# Patient Record
Sex: Female | Born: 1935 | Race: Black or African American | Hispanic: No
Health system: Southern US, Community
[De-identification: ages and names within clinical notes are randomized; demographics above are authoritative.]

## PROBLEM LIST (undated history)

## (undated) DIAGNOSIS — C50919 Malignant neoplasm of unspecified site of unspecified female breast: Secondary | ICD-10-CM

## (undated) DIAGNOSIS — I10 Essential (primary) hypertension: Secondary | ICD-10-CM

## (undated) DIAGNOSIS — I639 Cerebral infarction, unspecified: Secondary | ICD-10-CM

## (undated) DIAGNOSIS — G459 Transient cerebral ischemic attack, unspecified: Secondary | ICD-10-CM

## (undated) DIAGNOSIS — C801 Malignant (primary) neoplasm, unspecified: Secondary | ICD-10-CM

## (undated) HISTORY — PX: JOINT REPLACEMENT: SHX530

---

## 1999-12-02 ENCOUNTER — Emergency Department (HOSPITAL_COMMUNITY): Admission: EM | Admit: 1999-12-02 | Discharge: 1999-12-02 | Payer: Self-pay | Admitting: Emergency Medicine

## 2009-06-04 ENCOUNTER — Emergency Department (HOSPITAL_BASED_OUTPATIENT_CLINIC_OR_DEPARTMENT_OTHER): Admission: EM | Admit: 2009-06-04 | Discharge: 2009-06-04 | Payer: Self-pay | Admitting: Emergency Medicine

## 2009-06-04 ENCOUNTER — Ambulatory Visit: Payer: Self-pay | Admitting: Diagnostic Radiology

## 2009-06-25 ENCOUNTER — Emergency Department (HOSPITAL_BASED_OUTPATIENT_CLINIC_OR_DEPARTMENT_OTHER): Admission: EM | Admit: 2009-06-25 | Discharge: 2009-06-25 | Payer: Self-pay | Admitting: Emergency Medicine

## 2009-08-13 ENCOUNTER — Emergency Department (HOSPITAL_BASED_OUTPATIENT_CLINIC_OR_DEPARTMENT_OTHER): Admission: EM | Admit: 2009-08-13 | Discharge: 2009-08-13 | Payer: Self-pay | Admitting: Emergency Medicine

## 2009-08-13 ENCOUNTER — Ambulatory Visit: Payer: Self-pay | Admitting: Diagnostic Radiology

## 2009-10-29 ENCOUNTER — Emergency Department (HOSPITAL_BASED_OUTPATIENT_CLINIC_OR_DEPARTMENT_OTHER): Admission: EM | Admit: 2009-10-29 | Discharge: 2009-10-29 | Payer: Self-pay | Admitting: Emergency Medicine

## 2009-10-29 ENCOUNTER — Ambulatory Visit: Payer: Self-pay | Admitting: Radiology

## 2010-03-07 ENCOUNTER — Emergency Department (HOSPITAL_BASED_OUTPATIENT_CLINIC_OR_DEPARTMENT_OTHER)
Admission: EM | Admit: 2010-03-07 | Discharge: 2010-03-07 | Disposition: A | Discharge status: Home / Self Care | Payer: Medicare Other | Attending: Emergency Medicine | Admitting: Emergency Medicine

## 2010-03-07 DIAGNOSIS — I10 Essential (primary) hypertension: Secondary | ICD-10-CM | POA: Insufficient documentation

## 2010-03-07 DIAGNOSIS — Z79899 Other long term (current) drug therapy: Secondary | ICD-10-CM | POA: Insufficient documentation

## 2010-03-07 DIAGNOSIS — F0390 Unspecified dementia without behavioral disturbance: Secondary | ICD-10-CM | POA: Insufficient documentation

## 2010-03-07 DIAGNOSIS — Z8679 Personal history of other diseases of the circulatory system: Secondary | ICD-10-CM | POA: Insufficient documentation

## 2010-03-07 DIAGNOSIS — F329 Major depressive disorder, single episode, unspecified: Secondary | ICD-10-CM | POA: Insufficient documentation

## 2010-03-07 DIAGNOSIS — R51 Headache: Secondary | ICD-10-CM | POA: Insufficient documentation

## 2010-03-07 DIAGNOSIS — F3289 Other specified depressive episodes: Secondary | ICD-10-CM | POA: Insufficient documentation

## 2010-03-07 DIAGNOSIS — Z853 Personal history of malignant neoplasm of breast: Secondary | ICD-10-CM | POA: Insufficient documentation

## 2010-03-07 LAB — BASIC METABOLIC PANEL
BUN: 18 mg/dL (ref 6–23)
CO2: 30 mEq/L (ref 19–32)
Chloride: 103 mEq/L (ref 96–112)
GFR calc non Af Amer: >60 mL/min (ref >60)
Glucose, Bld: 157 mg/dL — ABNORMAL HIGH (ref 70–99)
Potassium: 3.3 mEq/L — ABNORMAL LOW (ref 3.5–5.1)
Sodium: 144 mEq/L (ref 135–145)

## 2010-03-07 LAB — URINALYSIS, ROUTINE W REFLEX MICROSCOPIC
Bilirubin Urine: NEGATIVE
Ketones, ur: NEGATIVE mg/dL
Specific Gravity, Urine: 1.023 (ref 1.005–1.030)
Urine Glucose, Fasting: NEGATIVE mg/dL
pH: 6 (ref 5.0–8.0)

## 2010-03-07 LAB — DIFFERENTIAL
Eosinophils Relative: 0 % (ref 0–5)
Lymphocytes Relative: 7 % — ABNORMAL LOW (ref 12–46)
Lymphs Abs: 0.5 10*3/uL — ABNORMAL LOW (ref 0.7–4.0)
Neutro Abs: 5.7 10*3/uL (ref 1.7–7.7)

## 2010-03-07 LAB — CBC
HCT: 36.3 % (ref 36.0–46.0)
Hemoglobin: 11.9 g/dL — ABNORMAL LOW (ref 12.0–15.0)
MCV: 89.6 fL (ref 78.0–100.0)
RDW: 13.1 % (ref 11.5–15.5)
WBC: 6.6 10*3/uL (ref 4.0–10.5)

## 2010-04-09 LAB — URINALYSIS, ROUTINE W REFLEX MICROSCOPIC
Bilirubin Urine: NEGATIVE
Glucose, UA: NEGATIVE mg/dL
Hgb urine dipstick: NEGATIVE
Specific Gravity, Urine: 1.013 (ref 1.005–1.030)
Urobilinogen, UA: 0.2 mg/dL (ref 0.0–1.0)

## 2010-04-09 LAB — BASIC METABOLIC PANEL
CO2: 33 mEq/L — ABNORMAL HIGH (ref 19–32)
Calcium: 8.8 mg/dL (ref 8.4–10.5)
Chloride: 101 mEq/L (ref 96–112)
GFR calc Af Amer: >60 mL/min (ref >60)
Glucose, Bld: 136 mg/dL — ABNORMAL HIGH (ref 70–99)
Potassium: 3.6 mEq/L (ref 3.5–5.1)
Sodium: 143 mEq/L (ref 135–145)

## 2010-04-09 LAB — DIFFERENTIAL
Basophils Relative: 1 % (ref 0–1)
Eosinophils Absolute: 0.1 10*3/uL (ref 0.0–0.7)
Lymphs Abs: 1.1 10*3/uL (ref 0.7–4.0)
Monocytes Absolute: 0.4 10*3/uL (ref 0.1–1.0)
Monocytes Relative: 6 % (ref 3–12)
Neutro Abs: 4.3 10*3/uL (ref 1.7–7.7)
Neutrophils Relative %: 72 % (ref 43–77)

## 2010-04-09 LAB — CBC
HCT: 32.9 % — ABNORMAL LOW (ref 36.0–46.0)
Hemoglobin: 11.5 g/dL — ABNORMAL LOW (ref 12.0–15.0)
MCH: 30.2 pg (ref 26.0–34.0)
MCHC: 34.9 g/dL (ref 30.0–36.0)
RBC: 3.81 MIL/uL — ABNORMAL LOW (ref 3.87–5.11)

## 2010-04-09 LAB — URINE MICROSCOPIC-ADD ON

## 2010-04-14 LAB — CULTURE, BLOOD (ROUTINE X 2)
Culture: NO GROWTH
Culture: NO GROWTH

## 2010-04-14 LAB — POCT CARDIAC MARKERS
CKMB, poc: 1.7 ng/mL (ref 1.0–8.0)
Myoglobin, poc: 91.3 ng/mL (ref 12–200)
Troponin i, poc: <0.05 ng/mL (ref 0.00–0.09)

## 2010-04-14 LAB — HEPATIC FUNCTION PANEL
ALT: 18 U/L (ref 0–35)
AST: 37 U/L (ref 0–37)
Albumin: 3.6 g/dL (ref 3.5–5.2)
Alkaline Phosphatase: 50 U/L (ref 39–117)
Bilirubin, Direct: 0 mg/dL (ref 0.0–0.3)
Indirect Bilirubin: 0.8 mg/dL (ref 0.3–0.9)
Total Bilirubin: 0.8 mg/dL (ref 0.3–1.2)
Total Protein: 7.4 g/dL (ref 6.0–8.3)

## 2010-04-14 LAB — BASIC METABOLIC PANEL
BUN: 10 mg/dL (ref 6–23)
CO2: 29 mEq/L (ref 19–32)
Calcium: 8.5 mg/dL (ref 8.4–10.5)
Chloride: 103 mEq/L (ref 96–112)
Creatinine, Ser: 0.7 mg/dL (ref 0.4–1.2)
Glucose, Bld: 133 mg/dL — ABNORMAL HIGH (ref 70–99)

## 2010-04-14 LAB — CBC
HCT: 36 % (ref 36.0–46.0)
Hemoglobin: 12.4 g/dL (ref 12.0–15.0)
MCHC: 34.5 g/dL (ref 30.0–36.0)
MCV: 88.4 fL (ref 78.0–100.0)
Platelets: 213 10*3/uL (ref 150–400)
RBC: 4.08 MIL/uL (ref 3.87–5.11)
RDW: 12.9 % (ref 11.5–15.5)
WBC: 6.2 K/uL (ref 4.0–10.5)

## 2010-04-14 LAB — URINALYSIS, ROUTINE W REFLEX MICROSCOPIC
Bilirubin Urine: NEGATIVE
Glucose, UA: NEGATIVE mg/dL
Hgb urine dipstick: NEGATIVE
Ketones, ur: 15 mg/dL — AB
Nitrite: NEGATIVE
Protein, ur: NEGATIVE mg/dL
Specific Gravity, Urine: 1.02 (ref 1.005–1.030)
Urobilinogen, UA: 1 mg/dL (ref 0.0–1.0)
pH: 7 (ref 5.0–8.0)

## 2010-04-14 LAB — URINE CULTURE
Colony Count: NO GROWTH
Culture: NO GROWTH

## 2010-04-14 LAB — DIFFERENTIAL
Basophils Absolute: 0 10*3/uL (ref 0.0–0.1)
Basophils Relative: 0 % (ref 0–1)
Eosinophils Absolute: 0 10*3/uL (ref 0.0–0.7)
Eosinophils Relative: 0 % (ref 0–5)
Lymphocytes Relative: 7 % — ABNORMAL LOW (ref 12–46)
Lymphs Abs: 0.4 K/uL — ABNORMAL LOW (ref 0.7–4.0)
Monocytes Absolute: 0.3 K/uL (ref 0.1–1.0)
Monocytes Relative: 6 % (ref 3–12)
Neutro Abs: 5.5 K/uL (ref 1.7–7.7)
Neutrophils Relative %: 87 % — ABNORMAL HIGH (ref 43–77)

## 2010-04-14 LAB — BASIC METABOLIC PANEL WITH GFR
GFR calc Af Amer: >60 mL/min (ref >60)
GFR calc non Af Amer: >60 mL/min (ref >60)
Potassium: 3.9 meq/L (ref 3.5–5.1)
Sodium: 141 meq/L (ref 135–145)

## 2010-05-11 ENCOUNTER — Emergency Department (HOSPITAL_BASED_OUTPATIENT_CLINIC_OR_DEPARTMENT_OTHER)
Admission: EM | Admit: 2010-05-11 | Discharge: 2010-05-11 | Disposition: A | Discharge status: Home / Self Care | Payer: Medicare Other | Attending: Emergency Medicine | Admitting: Emergency Medicine

## 2010-05-11 ENCOUNTER — Emergency Department (INDEPENDENT_AMBULATORY_CARE_PROVIDER_SITE_OTHER): Payer: Medicare Other

## 2010-05-11 DIAGNOSIS — I10 Essential (primary) hypertension: Secondary | ICD-10-CM | POA: Insufficient documentation

## 2010-05-11 DIAGNOSIS — Z79899 Other long term (current) drug therapy: Secondary | ICD-10-CM | POA: Insufficient documentation

## 2010-05-11 DIAGNOSIS — G319 Degenerative disease of nervous system, unspecified: Secondary | ICD-10-CM | POA: Insufficient documentation

## 2010-05-11 DIAGNOSIS — Z8679 Personal history of other diseases of the circulatory system: Secondary | ICD-10-CM | POA: Insufficient documentation

## 2010-05-11 DIAGNOSIS — W2209XA Striking against other stationary object, initial encounter: Secondary | ICD-10-CM | POA: Insufficient documentation

## 2010-05-11 DIAGNOSIS — S0990XA Unspecified injury of head, initial encounter: Secondary | ICD-10-CM | POA: Insufficient documentation

## 2010-05-11 DIAGNOSIS — F0390 Unspecified dementia without behavioral disturbance: Secondary | ICD-10-CM

## 2010-05-11 DIAGNOSIS — R51 Headache: Secondary | ICD-10-CM

## 2010-05-11 DIAGNOSIS — W19XXXA Unspecified fall, initial encounter: Secondary | ICD-10-CM

## 2010-05-11 DIAGNOSIS — F29 Unspecified psychosis not due to a substance or known physiological condition: Secondary | ICD-10-CM

## 2010-05-11 DIAGNOSIS — Y9210 Unspecified residential institution as the place of occurrence of the external cause: Secondary | ICD-10-CM | POA: Insufficient documentation

## 2010-05-11 DIAGNOSIS — Z853 Personal history of malignant neoplasm of breast: Secondary | ICD-10-CM | POA: Insufficient documentation

## 2010-08-05 ENCOUNTER — Emergency Department (HOSPITAL_BASED_OUTPATIENT_CLINIC_OR_DEPARTMENT_OTHER)
Admission: EM | Admit: 2010-08-05 | Discharge: 2010-08-05 | Disposition: A | Discharge status: Skilled Nursing Facility | Payer: Medicare Other | Attending: Emergency Medicine | Admitting: Emergency Medicine

## 2010-08-05 ENCOUNTER — Emergency Department (INDEPENDENT_AMBULATORY_CARE_PROVIDER_SITE_OTHER): Payer: Medicare Other

## 2010-08-05 ENCOUNTER — Encounter: Payer: Self-pay | Admitting: Family Medicine

## 2010-08-05 DIAGNOSIS — R51 Headache: Secondary | ICD-10-CM

## 2010-08-05 DIAGNOSIS — S8990XA Unspecified injury of unspecified lower leg, initial encounter: Secondary | ICD-10-CM

## 2010-08-05 DIAGNOSIS — M25569 Pain in unspecified knee: Secondary | ICD-10-CM | POA: Insufficient documentation

## 2010-08-05 DIAGNOSIS — Y9210 Unspecified residential institution as the place of occurrence of the external cause: Secondary | ICD-10-CM | POA: Insufficient documentation

## 2010-08-05 DIAGNOSIS — Z8673 Personal history of transient ischemic attack (TIA), and cerebral infarction without residual deficits: Secondary | ICD-10-CM

## 2010-08-05 DIAGNOSIS — W1789XA Other fall from one level to another, initial encounter: Secondary | ICD-10-CM | POA: Insufficient documentation

## 2010-08-05 DIAGNOSIS — G319 Degenerative disease of nervous system, unspecified: Secondary | ICD-10-CM

## 2010-08-05 DIAGNOSIS — F0150 Vascular dementia without behavioral disturbance: Secondary | ICD-10-CM | POA: Insufficient documentation

## 2010-08-05 DIAGNOSIS — W010XXA Fall on same level from slipping, tripping and stumbling without subsequent striking against object, initial encounter: Secondary | ICD-10-CM

## 2010-08-05 DIAGNOSIS — F0390 Unspecified dementia without behavioral disturbance: Secondary | ICD-10-CM | POA: Insufficient documentation

## 2010-08-05 DIAGNOSIS — W19XXXA Unspecified fall, initial encounter: Secondary | ICD-10-CM

## 2010-08-05 DIAGNOSIS — I679 Cerebrovascular disease, unspecified: Secondary | ICD-10-CM

## 2010-08-05 HISTORY — DX: Malignant (primary) neoplasm, unspecified: C80.1

## 2010-08-05 HISTORY — DX: Essential (primary) hypertension: I10

## 2010-08-05 HISTORY — DX: Transient cerebral ischemic attack, unspecified: G45.9

## 2010-08-05 NOTE — ED Notes (Signed)
Pt's son at bedside and sts pt "is acting normal". Pt smiling and talking.

## 2010-08-05 NOTE — ED Provider Notes (Addendum)
History     Chief Complaint  Patient presents with  . Fall   Patient is a 77 y.o. female presenting with fall. The history is provided by the nursing home. History Limited By: Dementia.  Fall The accident occurred less than 1 hour ago. The fall occurred while walking. She fell from a height of 1 to 2 ft. She landed on a hard floor. There was no blood loss. The point of impact was the head and right knee. The pain is present in the head and right knee. The pain is mild. There was no drug use involved in the accident. There was no alcohol use involved in the accident. She has tried nothing for the symptoms.    Past Medical History  Diagnosis Date  . Vascular dementia   . Hypertension   . Transient cerebrovascular ischemia   . Cancer     Past Surgical History  Procedure Date  . Joint replacement     History reviewed. No pertinent family history.  History  Substance Use Topics  . Smoking status: Not on file  . Smokeless tobacco: Not on file  . Alcohol Use: No     pt lives in LTCF    OB History    Grav Para Term Preterm Abortions TAB SAB Ect Mult Living                  Review of Systems  Unable to perform ROS   Physical Exam  BP 187/78  Pulse 56  Temp(Src) 98.5 F (36.9 C) (Oral)  Resp 21  SpO2 100%  Physical Exam  Constitutional: She appears well-developed and well-nourished. No distress.  HENT:       Has contusion over R supraorbital ridge laterally.  Eyes: Conjunctivae and EOM are normal. Pupils are equal, round, and reactive to light.  Neck: Normal range of motion. Neck supple.  Cardiovascular: Normal rate and regular rhythm.   Pulmonary/Chest: Effort normal and breath sounds normal.  Abdominal: Bowel sounds are normal.  Musculoskeletal:       She has bony eburnation of the right knee, suggesting osteoarthritic change.  Neurological: She is alert. No cranial nerve deficit.       Sensory and motor intact.  Skin: Skin is warm and dry.  Psychiatric:      Pleasantly demented.    ED Course  Procedures  MDM   CT of head shows a contusion of the right supraorbital ridge. An incidental finding was a possible basilar artery aneurysm; radiology suggested CTA of head or MRI to further evaluate this.  X-ray of the right knee shows osteoarthritic changes. Released back to her nursing home.        Carleene Cooper III, MD 08/05/10 1610  Carleene Cooper III, MD 08/05/10 [phone]

## 2010-08-05 NOTE — ED Notes (Signed)
[facility] phoned for pt transport back to [facility].

## 2010-08-05 NOTE — ED Notes (Signed)
Per EMS, pt fell at [facility] from standing, pt hit head on floor, no LOC. Pt "knocked off balance by other resident". No n/v. Pt c/o right eye pain. Pt has hematoma above right eye.

## 2010-12-29 ENCOUNTER — Emergency Department (HOSPITAL_BASED_OUTPATIENT_CLINIC_OR_DEPARTMENT_OTHER)
Admission: EM | Admit: 2010-12-29 | Discharge: 2010-12-30 | Disposition: A | Discharge status: Home / Self Care | Payer: Medicare Other | Attending: Emergency Medicine | Admitting: Emergency Medicine

## 2010-12-29 ENCOUNTER — Encounter (HOSPITAL_BASED_OUTPATIENT_CLINIC_OR_DEPARTMENT_OTHER): Payer: Self-pay | Admitting: Emergency Medicine

## 2010-12-29 DIAGNOSIS — I10 Essential (primary) hypertension: Secondary | ICD-10-CM | POA: Insufficient documentation

## 2010-12-29 DIAGNOSIS — W19XXXA Unspecified fall, initial encounter: Secondary | ICD-10-CM | POA: Insufficient documentation

## 2010-12-29 DIAGNOSIS — F0150 Vascular dementia without behavioral disturbance: Secondary | ICD-10-CM | POA: Insufficient documentation

## 2010-12-29 DIAGNOSIS — Z79899 Other long term (current) drug therapy: Secondary | ICD-10-CM | POA: Insufficient documentation

## 2010-12-29 DIAGNOSIS — Z8679 Personal history of other diseases of the circulatory system: Secondary | ICD-10-CM | POA: Insufficient documentation

## 2010-12-29 DIAGNOSIS — I672 Cerebral atherosclerosis: Secondary | ICD-10-CM | POA: Insufficient documentation

## 2010-12-29 DIAGNOSIS — M25559 Pain in unspecified hip: Secondary | ICD-10-CM | POA: Insufficient documentation

## 2010-12-29 DIAGNOSIS — Z9181 History of falling: Secondary | ICD-10-CM

## 2010-12-29 NOTE — ED Notes (Signed)
Pt was un witness fall at nsg facility c/o right hip pain.

## 2010-12-30 NOTE — ED Notes (Signed)
[facility] notified of need to transport pt back to nursing home.

## 2010-12-30 NOTE — ED Notes (Signed)
[facility] here for transport. 

## 2010-12-30 NOTE — ED Provider Notes (Addendum)
History     CSN: [id number] Arrival date & time: 12/29/2010 11:50 PM   First MD Initiated Contact with Patient 12/30/10 0120     Level 5 dementia Chief Complaint  Patient presents with  . Fall  . Hip Pain    (Consider location/radiation/quality/duration/timing/severity/associated sxs/prior treatment) HPI  Patient sent from nursing facility with report of unwitnessed fall with right hip pain.    Past Medical History  Diagnosis Date  . Vascular dementia   . Hypertension   . Transient cerebrovascular ischemia   . Cancer     Past Surgical History  Procedure Date  . Joint replacement     No family history on file.  History  Substance Use Topics  . Smoking status: Not on file  . Smokeless tobacco: Not on file  . Alcohol Use: No     pt lives in LTCF    OB History    Grav Para Term Preterm Abortions TAB SAB Ect Mult Living                  Review of Systems  Unable to perform ROS   Allergies  Penicillins  Home Medications   Current Outpatient Rx  Name Route Sig Dispense Refill  . ACETAMINOPHEN 325 MG PO TABS Oral Take 650 mg by mouth every 4 (four) hours as needed. As needed for pain      . CHOLECALCIFEROL 1000 UNITS PO TABS Oral Take 1,000 Units by mouth daily.      Marland Kitchen CITALOPRAM HYDROBROMIDE 20 MG PO TABS Oral Take 20 mg by mouth daily.     Marland Kitchen CLOPIDOGREL BISULFATE 75 MG PO TABS Oral Take 75 mg by mouth daily.      Marland Kitchen DIVALPROEX SODIUM 125 MG PO CPSP Oral Take 125 mg by mouth at bedtime.     [place]on Nations IMMUNE HEALTH PO LIQD Oral Take 237 mLs by mouth daily at 3 pm.      . FERROUS SULFATE 325 (65 FE) MG PO TABS Oral Take 325 mg by mouth daily with breakfast.      . HYDRALAZINE HCL 10 MG PO TABS Oral Take 20 mg by mouth 2 (two) times daily. Given at 8am and 2pm    . LABETALOL HCL 100 MG PO TABS Oral Take 100 mg by mouth 3 (three) times daily.     Marland Kitchen LORAZEPAM 0.5 MG PO TABS Oral Take 0.5 mg by mouth every 8 (eight) hours as needed. For anxiety and agitation      . LOSARTAN POTASSIUM 100 MG PO TABS Oral Take 100 mg by mouth daily.      Marland Kitchen MEMANTINE HCL 5 MG PO TABS Oral Take 5 mg by mouth daily.     Marland Kitchen PRESCRIPTION MEDICATION Topical Apply 1 mL topically every 6 (six) hours as needed. Lorazepam 0.5mg /11ml gel         For agitation and anxiety      . RANITIDINE HCL 75 MG PO TABS Oral Take 75 mg by mouth daily.     Marland Kitchen RIVASTIGMINE TARTRATE 3 MG PO CAPS Oral Take 3 mg by mouth daily.     . TRIAMTERENE-HCTZ 37.5-25 MG PO TABS Oral Take 0.5 tablets by mouth daily.     Marland Kitchen ZOLPIDEM TARTRATE ER 6.25 MG PO TBCR Oral Take 5 mg by mouth at bedtime as needed. For sleep    . ZOLPIDEM TARTRATE 5 MG PO TABS Oral Take 5 mg by mouth at bedtime as needed. For sleep  BP 155/85  Pulse 60  Temp(Src) 97.9 F (36.6 C) (Oral)  Resp 18  SpO2 98%  Physical Exam  Nursing note and vitals reviewed. Constitutional: She appears well-developed and well-nourished.  HENT:  Head: Normocephalic.  Right Ear: External ear normal.  Left Ear: External ear normal.  Nose: Nose normal.  Mouth/Throat: Oropharynx is clear and moist.  Eyes: Conjunctivae and EOM are normal. Pupils are equal, round, and reactive to light.  Neck: Normal range of motion. Neck supple.  Cardiovascular: Normal rate, regular rhythm, normal heart sounds and intact distal pulses.   Pulmonary/Chest: Effort normal and breath sounds normal.  Abdominal: Soft. Bowel sounds are normal.  Musculoskeletal: Normal range of motion.       Entire spine nontender, no tenderness over hips  Neurological: She is alert. She has normal reflexes.       Patient not oriented , but speaks clearly and is pleasantly confused- reports her children are 3 and six months.  Patient ambulated without difficulty  Skin: Skin is warm and dry.  Psychiatric: She has a normal mood and affect.    ED Course  Procedures (including critical care time)  Labs Reviewed - No data to display No results found.   No diagnosis found.    MDM          Hilario Quarry, MD 12/30/10 1610  Hilario Quarry, MD 12/30/10 [phone]

## 2010-12-30 NOTE — ED Notes (Signed)
Report called to Toribio Harbour, med tech at [facility]

## 2011-01-28 ENCOUNTER — Encounter (HOSPITAL_BASED_OUTPATIENT_CLINIC_OR_DEPARTMENT_OTHER): Payer: Self-pay | Admitting: *Deleted

## 2011-01-28 ENCOUNTER — Emergency Department (HOSPITAL_BASED_OUTPATIENT_CLINIC_OR_DEPARTMENT_OTHER)
Admission: EM | Admit: 2011-01-28 | Discharge: 2011-01-29 | Disposition: A | Discharge status: Skilled Nursing Facility | Payer: Medicare Other | Attending: Emergency Medicine | Admitting: Emergency Medicine

## 2011-01-28 DIAGNOSIS — Z9181 History of falling: Secondary | ICD-10-CM | POA: Insufficient documentation

## 2011-01-28 DIAGNOSIS — M79609 Pain in unspecified limb: Secondary | ICD-10-CM | POA: Insufficient documentation

## 2011-01-28 DIAGNOSIS — I10 Essential (primary) hypertension: Secondary | ICD-10-CM | POA: Insufficient documentation

## 2011-01-28 DIAGNOSIS — M25559 Pain in unspecified hip: Secondary | ICD-10-CM | POA: Insufficient documentation

## 2011-01-28 DIAGNOSIS — W19XXXA Unspecified fall, initial encounter: Secondary | ICD-10-CM

## 2011-01-28 DIAGNOSIS — D649 Anemia, unspecified: Secondary | ICD-10-CM

## 2011-01-28 DIAGNOSIS — Z8679 Personal history of other diseases of the circulatory system: Secondary | ICD-10-CM | POA: Insufficient documentation

## 2011-01-28 DIAGNOSIS — G9389 Other specified disorders of brain: Secondary | ICD-10-CM | POA: Insufficient documentation

## 2011-01-28 DIAGNOSIS — M79604 Pain in right leg: Secondary | ICD-10-CM

## 2011-01-28 DIAGNOSIS — Z79899 Other long term (current) drug therapy: Secondary | ICD-10-CM | POA: Insufficient documentation

## 2011-01-28 NOTE — ED Notes (Signed)
Per EMS: pt from [facility]. Pt here for unwitnessed fall. Pt was found crawling on all four extremities by staff members in another residents room this evening. No obvious injuries. Pt is confused per norm. EMS notes pt has strong odor to urine and soaked brief.

## 2011-01-29 ENCOUNTER — Emergency Department (INDEPENDENT_AMBULATORY_CARE_PROVIDER_SITE_OTHER): Payer: Medicare Other

## 2011-01-29 DIAGNOSIS — M79609 Pain in unspecified limb: Secondary | ICD-10-CM

## 2011-01-29 DIAGNOSIS — R51 Headache: Secondary | ICD-10-CM

## 2011-01-29 DIAGNOSIS — W19XXXA Unspecified fall, initial encounter: Secondary | ICD-10-CM

## 2011-01-29 DIAGNOSIS — F29 Unspecified psychosis not due to a substance or known physiological condition: Secondary | ICD-10-CM

## 2011-01-29 DIAGNOSIS — I6350 Cerebral infarction due to unspecified occlusion or stenosis of unspecified cerebral artery: Secondary | ICD-10-CM

## 2011-01-29 DIAGNOSIS — M25559 Pain in unspecified hip: Secondary | ICD-10-CM

## 2011-01-29 LAB — BASIC METABOLIC PANEL
CO2: 28 mEq/L (ref 19–32)
Chloride: 102 mEq/L (ref 96–112)
GFR calc Af Amer: 53 mL/min — ABNORMAL LOW (ref >90)
Potassium: 3.3 mEq/L — ABNORMAL LOW (ref 3.5–5.1)
Sodium: 140 mEq/L (ref 135–145)

## 2011-01-29 LAB — CBC
HCT: 33.1 % — ABNORMAL LOW (ref 36.0–46.0)
Hemoglobin: 10.8 g/dL — ABNORMAL LOW (ref 12.0–15.0)
RBC: 3.68 MIL/uL — ABNORMAL LOW (ref 3.87–5.11)
RDW: 13 % (ref 11.5–15.5)
WBC: 6.2 10*3/uL (ref 4.0–10.5)

## 2011-01-29 LAB — URINALYSIS, ROUTINE W REFLEX MICROSCOPIC
Glucose, UA: NEGATIVE mg/dL
Leukocytes, UA: NEGATIVE
Nitrite: NEGATIVE
Specific Gravity, Urine: 1.027 (ref 1.005–1.030)
pH: 5 (ref 5.0–8.0)

## 2011-01-29 NOTE — ED Provider Notes (Addendum)
History     CSN: [id number]  Arrival date & time 01/28/11  2348   First MD Initiated Contact with Patient 01/29/11 0100      Chief Complaint  Patient presents with  . Fall    (Consider location/radiation/quality/duration/timing/severity/associated sxs/prior treatment) HPI Comments: Patient is a demented elderly female with a history of recurrent falls.  According to EMS and nursing home report the patient was found on the floor of another persons bedroom on her back. Paramedics found the patient crawling on her hands and knees complaining of right leg pain. Onset unknown, no associated symptoms per the son who was with the patient earlier in the day and states that she was at her baseline.  Patient is a 78 y.o. female presenting with fall. The history is provided by the nursing home, the EMS personnel and medical records. The history is limited by the condition of the patient (Dementia).  Fall    Past Medical History  Diagnosis Date  . Vascular dementia   . Hypertension   . Transient cerebrovascular ischemia   . Cancer     Past Surgical History  Procedure Date  . Joint replacement     No family history on file.  History  Substance Use Topics  . Smoking status: Not on file  . Smokeless tobacco: Not on file  . Alcohol Use: No     pt lives in LTCF    OB History    Grav Para Term Preterm Abortions TAB SAB Ect Mult Living                  Review of Systems  Unable to perform ROS: Dementia    Allergies  Penicillins  Home Medications   Current Outpatient Rx  Name Route Sig Dispense Refill  . ACETAMINOPHEN 325 MG PO TABS Oral Take 650 mg by mouth every 4 (four) hours as needed. As needed for pain      . CITALOPRAM HYDROBROMIDE 20 MG PO TABS Oral Take 20 mg by mouth daily.     Marland Kitchen CLOPIDOGREL BISULFATE 75 MG PO TABS Oral Take 75 mg by mouth daily.      Marland Kitchen FERROUS SULFATE 325 (65 FE) MG PO TABS Oral Take 325 mg by mouth daily with breakfast.      . HYDRALAZINE  HCL 10 MG PO TABS Oral Take 20 mg by mouth 2 (two) times daily. Given at 8am and 2pm    . LABETALOL HCL 100 MG PO TABS Oral Take 200 mg by mouth 2 (two) times daily.     Marland Kitchen LOSARTAN POTASSIUM 100 MG PO TABS Oral Take 100 mg by mouth daily.      Marland Kitchen MEMANTINE HCL 5 MG PO TABS Oral Take 5 mg by mouth daily.     Marland Kitchen RANITIDINE HCL 75 MG PO TABS Oral Take 75 mg by mouth daily.     Marland Kitchen RIVASTIGMINE TARTRATE 3 MG PO CAPS Oral Take 3 mg by mouth daily.     . TRAMADOL HCL 50 MG PO TABS Oral Take 50 mg by mouth every morning.      . TRIAMTERENE-HCTZ 37.5-25 MG PO TABS Oral Take 0.5 tablets by mouth daily.     . CHOLECALCIFEROL 1000 UNITS PO TABS Oral Take 1,000 Units by mouth daily.      Marland Kitchen DIVALPROEX SODIUM 125 MG PO CPSP Oral Take 125 mg by mouth at bedtime.     [employer] IMMUNE HEALTH PO LIQD Oral Take 237 mLs by mouth  daily at 3 pm.      . LORAZEPAM 0.5 MG PO TABS Oral Take 0.5 mg by mouth every 8 (eight) hours as needed. For anxiety and agitation    . PRESCRIPTION MEDICATION Topical Apply 1 mL topically every 6 (six) hours as needed. Lorazepam 0.5mg /35ml gel         For agitation and anxiety      . ZOLPIDEM TARTRATE ER 6.25 MG PO TBCR Oral Take 5 mg by mouth at bedtime as needed. For sleep    . ZOLPIDEM TARTRATE 5 MG PO TABS Oral Take 5 mg by mouth at bedtime as needed. For sleep       BP 192/77  Pulse 72  Temp(Src) 98.5 F (36.9 C) (Oral)  Resp 20  SpO2 100%  Physical Exam  Nursing note and vitals reviewed. Constitutional: She appears well-developed and well-nourished. No distress.  HENT:  Head: Normocephalic and atraumatic.  Mouth/Throat: Oropharynx is clear and moist. No oropharyngeal exudate.  Eyes: Conjunctivae and EOM are normal. Pupils are equal, round, and reactive to light. Right eye exhibits no discharge. Left eye exhibits no discharge. No scleral icterus.  Neck: Normal range of motion. Neck supple. No JVD present. No thyromegaly present.  Cardiovascular: Normal rate, regular rhythm and  intact distal pulses.  Exam reveals no gallop and no friction rub.   Murmur ( 2/6 systolic murmur) heard. Pulmonary/Chest: Effort normal and breath sounds normal. No respiratory distress. She has no wheezes. She has no rales.  Abdominal: Soft. Bowel sounds are normal. She exhibits no distension and no mass. There is no tenderness.  Musculoskeletal: Normal range of motion. She exhibits tenderness ( Mild tenderness with range of motion of the right leg. She is able to flex the hip to 90 and the knee to 90. No deformity or shortening of the leg). She exhibits no edema.  Lymphadenopathy:    She has no cervical adenopathy.  Neurological: She is alert. Coordination normal.       Disoriented, follows commands, speech is clear  Skin: Skin is warm and dry. No rash noted. No erythema.  Psychiatric: She has a normal mood and affect. Her behavior is normal.    ED Course  Procedures (including critical care time)  Labs Reviewed  CBC - Abnormal; Notable for the following:    RBC 3.68 (*)    Hemoglobin 10.8 (*)    HCT 33.1 (*)    All other components within normal limits  URINALYSIS, ROUTINE W REFLEX MICROSCOPIC  BASIC METABOLIC PANEL   Dg Hip Complete Right  01/29/2011  *RADIOLOGY REPORT*  Clinical Data: Fall, right hip pain.  RIGHT HIP - COMPLETE 2+ VIEW  Comparison: 08/13/2009  Findings: Degenerative changes in the hips bilaterally, similar to prior study.  No acute bony abnormality.  No fracture, subluxation or dislocation.  Calcifications in the anatomic pelvis, likely fibroids.  IMPRESSION: Mild degenerative changes in the hips.  No acute findings.  Original Report Authenticated By: Cyndie Chime, M.D.   Dg Femur Right  01/29/2011  *RADIOLOGY REPORT*  Clinical Data: Fall, right femur pain.  RIGHT FEMUR - 2 VIEW  Comparison: 08/13/2009  Findings: Degenerative changes in the right hip and knee joints. No acute bony abnormality.  Specifically, no fracture, subluxation, or dislocation.  Soft tissues  are intact.  Vascular calcifications.  IMPRESSION: No acute bony abnormality.  Original Report Authenticated By: Cyndie Chime, M.D.   Ct Head Wo Contrast  01/29/2011  *RADIOLOGY REPORT*  Clinical Data: Fall, headache,  confusion.  CT HEAD WITHOUT CONTRAST  Technique:  Contiguous axial images were obtained from the base of the skull through the vertex without contrast.  Comparison: 08/05/2010  Findings: Old infarct in the left occipital lobe.  Chronic small vessel disease throughout the deep white matter, stable. No acute intracranial abnormality.  Specifically, no hemorrhage, hydrocephalus, mass lesion, acute infarction, or significant intracranial injury.  No acute calvarial abnormality. Visualized paranasal sinuses and mastoids clear.  Orbital soft tissues unremarkable.  IMPRESSION: Old left occipital infarct.  Chronic small vessel disease. No acute intracranial abnormality.  Original Report Authenticated By: Cyndie Chime, M.D.     1. Fall   2. Right leg pain   3. Anemia       MDM  There is some question as to whether the patient struck her head, there is no evidence of head injury on exam the patient is demented and cannot remember the fall. She has some tenderness in her right hip, imaging ordered and shows degenerative change but no fractures, as well as the femur. CT scan of the head shows no acute findings but has old occipital infarct.  Blood work shows slight anemia, urinalysis is clean.  Hemoglobin is slightly low and according to labs from 10 months ago has dropped approximately 1 g. Will refer to family Dr. for further workup of this gradual anemia.  Vital signs show no tachycardia or hypotension, no concern for instability at this time.        Vida Roller, MD 01/29/11 [id number]  Vida Roller, MD 01/29/11 [phone]

## 2011-01-29 NOTE — ED Notes (Signed)
[facility] called for transport.  

## 2011-01-29 NOTE — ED Notes (Signed)
[facility] arrived for transport 

## 2011-01-29 NOTE — ED Notes (Signed)
Patient cleaned; I/O cath completed to obtain UA.

## 2011-01-29 NOTE — ED Notes (Signed)
Pt holding onto her right thigh as if she is in pain. Pt groaning and wincing. Pt has good ROM, but continues to hold her leg stating it hurts. No obviuos deformity. Called nursing home staff and they state they do not recall pt having right upper leg pain.

## 2011-04-28 ENCOUNTER — Emergency Department (HOSPITAL_BASED_OUTPATIENT_CLINIC_OR_DEPARTMENT_OTHER)
Admission: EM | Admit: 2011-04-28 | Discharge: 2011-04-29 | Disposition: A | Discharge status: Home / Self Care | Payer: Medicare Other | Attending: Emergency Medicine | Admitting: Emergency Medicine

## 2011-04-28 ENCOUNTER — Encounter (HOSPITAL_BASED_OUTPATIENT_CLINIC_OR_DEPARTMENT_OTHER): Payer: Self-pay | Admitting: *Deleted

## 2011-04-28 DIAGNOSIS — S0990XA Unspecified injury of head, initial encounter: Secondary | ICD-10-CM | POA: Insufficient documentation

## 2011-04-28 DIAGNOSIS — F0150 Vascular dementia without behavioral disturbance: Secondary | ICD-10-CM | POA: Diagnosis not present

## 2011-04-28 DIAGNOSIS — W19XXXA Unspecified fall, initial encounter: Secondary | ICD-10-CM | POA: Diagnosis not present

## 2011-04-28 DIAGNOSIS — S0101XA Laceration without foreign body of scalp, initial encounter: Secondary | ICD-10-CM

## 2011-04-28 DIAGNOSIS — I10 Essential (primary) hypertension: Secondary | ICD-10-CM | POA: Insufficient documentation

## 2011-04-28 DIAGNOSIS — Z8673 Personal history of transient ischemic attack (TIA), and cerebral infarction without residual deficits: Secondary | ICD-10-CM | POA: Insufficient documentation

## 2011-04-28 DIAGNOSIS — Z79899 Other long term (current) drug therapy: Secondary | ICD-10-CM | POA: Diagnosis not present

## 2011-04-28 DIAGNOSIS — R51 Headache: Secondary | ICD-10-CM | POA: Insufficient documentation

## 2011-04-28 DIAGNOSIS — I672 Cerebral atherosclerosis: Secondary | ICD-10-CM | POA: Diagnosis not present

## 2011-04-28 DIAGNOSIS — M25559 Pain in unspecified hip: Secondary | ICD-10-CM | POA: Insufficient documentation

## 2011-04-28 DIAGNOSIS — S0100XA Unspecified open wound of scalp, initial encounter: Secondary | ICD-10-CM | POA: Diagnosis not present

## 2011-04-28 NOTE — ED Notes (Signed)
Pt brought by [facility] from [facility] for laceration to posterior head

## 2011-04-29 ENCOUNTER — Emergency Department (INDEPENDENT_AMBULATORY_CARE_PROVIDER_SITE_OTHER): Payer: Medicare Other

## 2011-04-29 DIAGNOSIS — W19XXXA Unspecified fall, initial encounter: Secondary | ICD-10-CM

## 2011-04-29 DIAGNOSIS — M25559 Pain in unspecified hip: Secondary | ICD-10-CM

## 2011-04-29 DIAGNOSIS — I10 Essential (primary) hypertension: Secondary | ICD-10-CM | POA: Diagnosis not present

## 2011-04-29 DIAGNOSIS — G9389 Other specified disorders of brain: Secondary | ICD-10-CM

## 2011-04-29 DIAGNOSIS — G319 Degenerative disease of nervous system, unspecified: Secondary | ICD-10-CM

## 2011-04-29 DIAGNOSIS — S1093XA Contusion of unspecified part of neck, initial encounter: Secondary | ICD-10-CM

## 2011-04-29 DIAGNOSIS — I672 Cerebral atherosclerosis: Secondary | ICD-10-CM | POA: Diagnosis not present

## 2011-04-29 DIAGNOSIS — S0100XA Unspecified open wound of scalp, initial encounter: Secondary | ICD-10-CM | POA: Diagnosis not present

## 2011-04-29 DIAGNOSIS — F0150 Vascular dementia without behavioral disturbance: Secondary | ICD-10-CM | POA: Diagnosis not present

## 2011-04-29 DIAGNOSIS — Z8673 Personal history of transient ischemic attack (TIA), and cerebral infarction without residual deficits: Secondary | ICD-10-CM | POA: Diagnosis not present

## 2011-04-29 DIAGNOSIS — S0003XA Contusion of scalp, initial encounter: Secondary | ICD-10-CM

## 2011-04-29 DIAGNOSIS — S0990XA Unspecified injury of head, initial encounter: Secondary | ICD-10-CM | POA: Diagnosis present

## 2011-04-29 DIAGNOSIS — Z79899 Other long term (current) drug therapy: Secondary | ICD-10-CM | POA: Diagnosis not present

## 2011-04-29 DIAGNOSIS — R51 Headache: Secondary | ICD-10-CM | POA: Diagnosis not present

## 2011-04-29 NOTE — Discharge Instructions (Signed)
Laceration Care, Adult A laceration is a cut that goes through all layers of the skin. The cut goes into the tissue beneath the skin. HOME CARE For stitches (sutures) or staples:  Keep the cut clean and dry.   If you have a bandage (dressing), change it at least once a day. Change the bandage if it gets wet or dirty, or as told by your doctor.   Wash the cut with soap and water 2 times a day. Rinse the cut with water. Pat it dry with a clean towel.   Put a thin layer of medicated cream on the cut as told by your doctor.   You may shower after the first 24 hours. Do not soak the cut in water until the stitches are removed.   Only take medicines as told by your doctor.   Have your stitches or staples removed as told by your doctor.  For skin adhesive strips:  Keep the cut clean and dry.   Do not get the strips wet. You may take a bath, but be careful to keep the cut dry.   If the cut gets wet, pat it dry with a clean towel.   The strips will fall off on their own. Do not remove the strips that are still stuck to the cut.  For wound glue:  You may shower or take baths. Do not soak or scrub the cut. Do not swim. Avoid heavy sweating until the glue falls off on its own. After a shower or bath, pat the cut dry with a clean towel.   Do not put medicine on your cut until the glue falls off.   If you have a bandage, do not put tape over the glue.   Avoid lots of sunlight or tanning lamps until the glue falls off. Put sunscreen on the cut for the first year to reduce your scar.   The glue will fall off on its own. Do not pick at the glue.  You may need a tetanus shot if:  You cannot remember when you had your last tetanus shot.   You have never had a tetanus shot.  If you need a tetanus shot and you choose not to have one, you may get tetanus. Sickness from tetanus can be serious. GET HELP RIGHT AWAY IF:   Your pain does not get better with medicine.   Your arm, hand, leg, or  foot loses feeling (numbness) or changes color.   Your cut is bleeding.   Your joint feels weak, or you cannot use your joint.   You have painful lumps on your body.   Your cut is red, puffy (swollen), or painful.   You have a red line on the skin near the cut.   You have yellowish-white fluid (pus) coming from the cut.   You have a fever.   You have a bad smell coming from the cut or bandage.   Your cut breaks open before or after stitches are removed.   You notice something coming out of the cut, such as wood or glass.   You cannot move a finger or toe.  MAKE SURE YOU:   Understand these instructions.   Will watch your condition.   Will get help right away if you are not doing well or get worse.  Document Released: 06/30/2007 Document Revised: 12/31/2010 Document Reviewed: 07/07/2010 [facility] Patient Information 2012 [place], [place].  Staple removal in 7-10 days. This can be done by her primary care  doctor or she can be brought back here. Keep wound of the scalp dry for 24 hours and she may bathe as needed. Apply bacitracin ointment to the wound twice a day. Return for new worse symptoms. CT scan of the head showed no skull fracture no intracranial injury. X-rays of the pelvis and both hips were negative for fracture.

## 2011-04-29 NOTE — ED Provider Notes (Addendum)
History     CSN: [id number]  Arrival date & time 04/28/11  2257   First MD Initiated Contact with Patient 04/28/11 2349      Chief Complaint  Patient presents with  . Head Laceration    (Consider location/radiation/quality/duration/timing/severity/associated sxs/prior treatment) Patient is a 78 y.o. female presenting with fall.  Fall The accident occurred 6 to 12 hours ago (Injury occurred sometime today.). Incident: Specifics of fall are not known as just presume that she had a fall due to the injury to the back of the head. The volume of blood lost was minimal. The point of impact was the head. The pain is present in the head and right hip. The pain is at a severity of 3/10. The pain is mild. She was not ambulatory at the scene (Patient ambulates only with a walker and assistance.).   patient from nursing home brought in by son presumed fall not witnessed patient has history of dementia level V caveat applies to the historical information obtained here.  Past Medical History  Diagnosis Date  . Vascular dementia   . Hypertension   . Transient cerebrovascular ischemia   . Cancer     Past Surgical History  Procedure Date  . Joint replacement     History reviewed. No pertinent family history.  History  Substance Use Topics  . Smoking status: Not on file  . Smokeless tobacco: Not on file  . Alcohol Use: No     pt lives in LTCF    OB History    Grav Para Term Preterm Abortions TAB SAB Ect Mult Living                  Review of Systems  Unable to perform ROS  level V caveat applies due to the patient's dementia and unable to provide appropriate records for occult data. Son does state that tetanus is up-to-date.  Allergies  Penicillins  Home Medications   Current Outpatient Rx  Name Route Sig Dispense Refill  . ACETAMINOPHEN 325 MG PO TABS Oral Take 650 mg by mouth every 4 (four) hours as needed. As needed for pain      . ATORVASTATIN CALCIUM 10 MG PO TABS Oral  Take 10 mg by mouth daily.    . CHOLECALCIFEROL 1000 UNITS PO TABS Oral Take 1,000 Units by mouth daily.      Marland Kitchen CITALOPRAM HYDROBROMIDE 20 MG PO TABS Oral Take 20 mg by mouth daily.     Marland Kitchen CLOPIDOGREL BISULFATE 75 MG PO TABS Oral Take 75 mg by mouth daily.      Marland Kitchen ENSURE IMMUNE HEALTH PO LIQD Oral Take 237 mLs by mouth daily at 3 pm.      . FERROUS SULFATE 325 (65 FE) MG PO TABS Oral Take 325 mg by mouth daily with breakfast.      . HYDRALAZINE HCL 10 MG PO TABS Oral Take 20 mg by mouth 2 (two) times daily. Given at 8am and 2pm    . LABETALOL HCL 100 MG PO TABS Oral Take 200 mg by mouth 2 (two) times daily.     Marland Kitchen LORAZEPAM 0.5 MG PO TABS Oral Take 0.5 mg by mouth every 8 (eight) hours as needed. For anxiety and agitation    . LOSARTAN POTASSIUM 100 MG PO TABS Oral Take 100 mg by mouth daily.      Marland Kitchen MEMANTINE HCL 5 MG PO TABS Oral Take 5 mg by mouth daily.     Marland Kitchen RANITIDINE HCL  75 MG PO TABS Oral Take 75 mg by mouth daily.     Marland Kitchen RIVASTIGMINE TARTRATE 3 MG PO CAPS Oral Take 3 mg by mouth daily.     . TRAMADOL HCL 50 MG PO TABS Oral Take 50 mg by mouth every morning.      . TRIAMTERENE-HCTZ 37.5-25 MG PO TABS Oral Take 0.5 tablets by mouth daily.     Marland Kitchen DIVALPROEX SODIUM 125 MG PO CPSP Oral Take 125 mg by mouth at bedtime.     Marland Kitchen PRESCRIPTION MEDICATION Topical Apply 1 mL topically every 6 (six) hours as needed. Lorazepam 0.5mg /55ml gel         For agitation and anxiety     . ZOLPIDEM TARTRATE ER 6.25 MG PO TBCR Oral Take 5 mg by mouth at bedtime as needed. For sleep    . ZOLPIDEM TARTRATE 5 MG PO TABS Oral Take 5 mg by mouth at bedtime as needed. For sleep       BP 196/78  Pulse 65  Temp(Src) 98.3 F (36.8 C) (Oral)  Resp 16  SpO2 99%  Physical Exam  Nursing note and vitals reviewed. Constitutional: She appears well-developed and well-nourished. No distress.  HENT:  Mouth/Throat: Oropharynx is clear and moist.       Left except a little area with a regular 2 cm scalp laceration minimal  bleeding. No step off. Neck nontender patient spontaneously moving around fine.  Eyes: Conjunctivae and EOM are normal. Pupils are equal, round, and reactive to light.  Neck: Normal range of motion. Neck supple.        HEENT exam for further information about the neck.  Cardiovascular: Normal rate, regular rhythm, normal heart sounds and intact distal pulses.   No murmur heard. Pulmonary/Chest: Effort normal and breath sounds normal. No respiratory distress. She has no wheezes. She has no rales. She exhibits no tenderness.  Abdominal: Soft. Bowel sounds are normal. There is no tenderness.  Musculoskeletal: Normal range of motion. She exhibits tenderness. She exhibits no edema.       Mild tenderness to right hip area.  Neurological: She is alert. No cranial nerve deficit. She exhibits normal muscle tone.  Skin: Skin is warm. No rash noted. She is not diaphoretic.    ED Course  Procedures (including critical care time)  Labs Reviewed - No data to display Dg Hip Bilateral W/pelvis  04/29/2011  *RADIOLOGY REPORT*  Clinical Data: Right hip pain after fall.  BILATERAL HIP WITH PELVIS - 4+ VIEW  Comparison: Abdomen 06/04/2009  Findings: The pelvis, SI joints, and symphysis pubis appear intact. No displaced fractures identified.  Mild degenerative changes in both hips.  No evidence of acute fracture or subluxation of either the right or the left hip.  No focal bone lesion or bone destruction.  Bone cortex and trabecular architecture appear intact.  No abnormal periosteal reaction.  Calcification in the pelvis consistent with calcified fibroids.  Extensive vascular calcifications.  Degenerative changes in the lower lumbar spine. Stable appearance since previous study.  IMPRESSION: Degenerative changes in the lumbar spine and hips.  No acute fractures demonstrated.  Original Report Authenticated By: Marlon Pel, M.D.   Ct Head Wo Contrast  04/29/2011  *RADIOLOGY REPORT*  Clinical Data: Larey Seat.  Hit  head.  CT HEAD WITHOUT CONTRAST  Technique:  Contiguous axial images were obtained from the base of the skull through the vertex without contrast.  Comparison: Head CT 01/29/2011.  Findings: Stable age related cerebral atrophy, ventriculomegaly and periventricular  white matter disease.  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  No extra-axial fluid collections are seen.  No CT findings for acute hemispheric infarction and/or intracranial hemorrhage.  There is a small left-sided posterior parietal/occipital scalp hematoma without underlying skull fracture.  The paranasal sinuses and mastoid air cells are clear.  The globes are intact.  IMPRESSION:  1.  Age related cerebral atrophy, ventriculomegaly and periventricular white matter disease. 2.  No acute intracranial findings or skull fracture.  Original Report Authenticated By: P. Loralie Champagne, M.D.     1. Fall   2. Scalp laceration    Scalp wound: Scalp irrigated with normal saline 3 surgical staples applied to the 2 cm left occipital area laceration. Patient tolerated procedure well bacitracin ointment applied to the wound.   MDM  Patient from a nursing facility brought in by son no history of fall the patient was found to have a irregular laceration to the posterior part of her head. Upon arrival here patient also complaining of right-sided hip pain. Patient has a history of dementia historical data is somewhat inaccurate it grew view of systems is not obtainable. Head CT was done to rule out skull fracture or intracranial injury scalp laceration repaired with surgical staples. Patient's tetanus is up-to-date. That information comes from the son. X-rays of lateral hips and pelvis pending.  X-rays results as as noted above no evidence of skull fracture no intracranial injury. Bilateral hip and pelvis x-rays without acute injury.      Shelda Jakes, MD 04/29/11 1610  Shelda Jakes, MD 04/29/11 [phone]

## 2011-05-06 ENCOUNTER — Encounter (HOSPITAL_BASED_OUTPATIENT_CLINIC_OR_DEPARTMENT_OTHER): Payer: Self-pay | Admitting: Emergency Medicine

## 2011-05-06 ENCOUNTER — Emergency Department (HOSPITAL_BASED_OUTPATIENT_CLINIC_OR_DEPARTMENT_OTHER)
Admission: EM | Admit: 2011-05-06 | Discharge: 2011-05-06 | Disposition: A | Discharge status: Skilled Nursing Facility | Payer: Medicare Other | Attending: Emergency Medicine | Admitting: Emergency Medicine

## 2011-05-06 DIAGNOSIS — Z880 Allergy status to penicillin: Secondary | ICD-10-CM | POA: Insufficient documentation

## 2011-05-06 DIAGNOSIS — I10 Essential (primary) hypertension: Secondary | ICD-10-CM | POA: Insufficient documentation

## 2011-05-06 DIAGNOSIS — Z4802 Encounter for removal of sutures: Secondary | ICD-10-CM | POA: Insufficient documentation

## 2011-05-06 NOTE — ED Notes (Signed)
3 staples removed without difficulty.  Laceration healed.

## 2011-05-06 NOTE — ED Provider Notes (Signed)
History     CSN: [id number]  Arrival date & time 05/06/11  1454   First MD Initiated Contact with Patient 05/06/11 1503      Chief Complaint  Patient presents with  . Suture / Staple Removal    (Consider location/radiation/quality/duration/timing/severity/associated sxs/prior treatment) Patient is a 78 y.o. female presenting with suture removal. The history is provided by the patient. No language interpreter was used.  Suture / Staple Removal  The sutures were placed 3 to 6 days ago. There has been no treatment since the wound repair. There has been no drainage from the wound. There is no redness present. There is no swelling present. The pain has no pain.    Past Medical History  Diagnosis Date  . Vascular dementia   . Hypertension   . Transient cerebrovascular ischemia   . Cancer     Past Surgical History  Procedure Date  . Joint replacement     History reviewed. No pertinent family history.  History  Substance Use Topics  . Smoking status: Not on file  . Smokeless tobacco: Not on file  . Alcohol Use: No     pt lives in LTCF    OB History    Grav Para Term Preterm Abortions TAB SAB Ect Mult Living                  Review of Systems  Skin: Positive for wound.  All other systems reviewed and are negative.    Allergies  Penicillins  Home Medications   Current Outpatient Rx  Name Route Sig Dispense Refill  . ACETAMINOPHEN 325 MG PO TABS Oral Take 650 mg by mouth every 4 (four) hours as needed. As needed for pain      . ATORVASTATIN CALCIUM 10 MG PO TABS Oral Take 10 mg by mouth daily.    . CHOLECALCIFEROL 1000 UNITS PO TABS Oral Take 1,000 Units by mouth daily.      Marland Kitchen CITALOPRAM HYDROBROMIDE 20 MG PO TABS Oral Take 20 mg by mouth daily.     Marland Kitchen CLOPIDOGREL BISULFATE 75 MG PO TABS Oral Take 75 mg by mouth daily.      Marland Kitchen DIVALPROEX SODIUM 125 MG PO CPSP Oral Take 125 mg by mouth at bedtime.     [place] IMMUNE HEALTH PO LIQD Oral Take 237 mLs by mouth  daily at 3 pm.      . FERROUS SULFATE 325 (65 FE) MG PO TABS Oral Take 325 mg by mouth daily with breakfast.      . HYDRALAZINE HCL 10 MG PO TABS Oral Take 20 mg by mouth 2 (two) times daily. Given at 8am and 2pm    . LABETALOL HCL 100 MG PO TABS Oral Take 200 mg by mouth 2 (two) times daily.     Marland Kitchen LORAZEPAM 0.5 MG PO TABS Oral Take 0.5 mg by mouth every 8 (eight) hours as needed. For anxiety and agitation    . LOSARTAN POTASSIUM 100 MG PO TABS Oral Take 100 mg by mouth daily.      Marland Kitchen MEMANTINE HCL 5 MG PO TABS Oral Take 5 mg by mouth daily.     Marland Kitchen PRESCRIPTION MEDICATION Topical Apply 1 mL topically every 6 (six) hours as needed. Lorazepam 0.5mg /35ml gel         For agitation and anxiety     . RANITIDINE HCL 75 MG PO TABS Oral Take 75 mg by mouth daily.     Marland Kitchen RIVASTIGMINE TARTRATE 3  MG PO CAPS Oral Take 3 mg by mouth daily.     . TRAMADOL HCL 50 MG PO TABS Oral Take 50 mg by mouth every morning.      . TRIAMTERENE-HCTZ 37.5-25 MG PO TABS Oral Take 0.5 tablets by mouth daily.     Marland Kitchen ZOLPIDEM TARTRATE ER 6.25 MG PO TBCR Oral Take 5 mg by mouth at bedtime as needed. For sleep    . ZOLPIDEM TARTRATE 5 MG PO TABS Oral Take 5 mg by mouth at bedtime as needed. For sleep       BP 175/68  Pulse 54  Temp(Src) 97.5 F (36.4 C) (Oral)  Resp 20  SpO2 99%  Physical Exam  Nursing note and vitals reviewed. Constitutional: She appears well-developed and well-nourished.  HENT:  Head: Normocephalic.  Eyes: Pupils are equal, round, and reactive to light.  Neurological: She is alert.  Skin: Skin is warm.  Psychiatric: She has a normal mood and affect.  healed laceration scalp  ED Course  Procedures (including critical care time)  Labs Reviewed - No data to display No results found.   No diagnosis found.    MDM          Elson Areas, PA 05/06/11 1519  Azalia Bilis, MD 11/23/17 [phone]

## 2011-05-06 NOTE — Discharge Instructions (Signed)
Staple Removal  Care After  The staples used to close your skin have been removed. The wound needs continued care so it can heal completely and without problems. The care described here will need to be done for another 5-10 days unless your caregiver advises otherwise.   HOME CARE INSTRUCTIONS    Keep wound site dry and clean.   If skin adhesive strips were applied after the staples were removed, they will begin to peel off in a few days. If they remain after fourteen days, they may be peeled off and discarded.   If you still have a dressing, change it at least once a day or as instructed by your caregiver. If the bandage sticks, soak it off with warm water. Pat dry with a clean towel. Look for signs of infection (see below).   Reapply cream or ointment according to your caregiver's instruction. This will help prevent infection and keep the bandage from sticking. Use of a non-stick material over the wound and under the dressing or wrap will also help keep the bandage from sticking.   If the bandage becomes wet, dirty or develops a foul smell, change it as soon as possible.   New scars become sunburned easily. Use sunscreens with protection factor (SPF) of at least 15 when out in the sun.   Only take over-the-counter or prescription medicines for pain, discomfort or fever as directed by your caregiver.  SEEK IMMEDIATE MEDICAL CARE IF:    There is redness, swelling or increasing pain in the wound.   Pus is coming from the wound.   An unexplained oral temperature above 102 F (38.9 C) develops.   You notice a foul smell coming from the wound or dressing.   There is a breaking open of the suture line (edges not staying together) of the wound edges after staples have been removed.  Document Released: 12/25/2007 Document Revised: 12/31/2010 Document Reviewed: 12/25/2007  [employer] Patient Information 2012 [employer]

## 2011-05-25 ENCOUNTER — Encounter (HOSPITAL_BASED_OUTPATIENT_CLINIC_OR_DEPARTMENT_OTHER): Payer: Self-pay | Admitting: *Deleted

## 2011-05-25 ENCOUNTER — Emergency Department (HOSPITAL_BASED_OUTPATIENT_CLINIC_OR_DEPARTMENT_OTHER)
Admission: EM | Admit: 2011-05-25 | Discharge: 2011-05-25 | Disposition: A | Discharge status: Home / Self Care | Payer: Medicare Other | Attending: Emergency Medicine | Admitting: Emergency Medicine

## 2011-05-25 ENCOUNTER — Emergency Department (INDEPENDENT_AMBULATORY_CARE_PROVIDER_SITE_OTHER): Payer: Medicare Other

## 2011-05-25 ENCOUNTER — Other Ambulatory Visit (HOSPITAL_BASED_OUTPATIENT_CLINIC_OR_DEPARTMENT_OTHER): Payer: Medicare Other

## 2011-05-25 DIAGNOSIS — F0390 Unspecified dementia without behavioral disturbance: Secondary | ICD-10-CM | POA: Insufficient documentation

## 2011-05-25 DIAGNOSIS — I10 Essential (primary) hypertension: Secondary | ICD-10-CM | POA: Insufficient documentation

## 2011-05-25 DIAGNOSIS — Z853 Personal history of malignant neoplasm of breast: Secondary | ICD-10-CM | POA: Insufficient documentation

## 2011-05-25 DIAGNOSIS — M949 Disorder of cartilage, unspecified: Secondary | ICD-10-CM

## 2011-05-25 DIAGNOSIS — Z8673 Personal history of transient ischemic attack (TIA), and cerebral infarction without residual deficits: Secondary | ICD-10-CM | POA: Insufficient documentation

## 2011-05-25 DIAGNOSIS — M79609 Pain in unspecified limb: Secondary | ICD-10-CM

## 2011-05-25 DIAGNOSIS — Z79899 Other long term (current) drug therapy: Secondary | ICD-10-CM | POA: Insufficient documentation

## 2011-05-25 DIAGNOSIS — Y9210 Unspecified residential institution as the place of occurrence of the external cause: Secondary | ICD-10-CM | POA: Insufficient documentation

## 2011-05-25 DIAGNOSIS — W06XXXA Fall from bed, initial encounter: Secondary | ICD-10-CM | POA: Insufficient documentation

## 2011-05-25 DIAGNOSIS — M25559 Pain in unspecified hip: Secondary | ICD-10-CM | POA: Insufficient documentation

## 2011-05-25 DIAGNOSIS — W19XXXA Unspecified fall, initial encounter: Secondary | ICD-10-CM

## 2011-05-25 HISTORY — DX: Malignant neoplasm of unspecified site of unspecified female breast: C50.919

## 2011-05-25 NOTE — ED Notes (Signed)
Rolled out of bed  inj to rt hip and thigh

## 2011-05-25 NOTE — ED Notes (Signed)
Pt was able to ambulate without assistance from the bed to the chair.

## 2011-05-25 NOTE — ED Provider Notes (Signed)
History     CSN: [id number]  Arrival date & time 05/25/11  1724   First MD Initiated Contact with Patient 05/25/11 1730      Chief Complaint  Patient presents with  . Fall    (Consider location/radiation/quality/duration/timing/severity/associated sxs/prior treatment) HPI Comments: Level 5 caveat due to dementia.  Based on nursing home report to EMS to our staff, pt rolled out of bed and had pain to right hip.  Pt denies recalling a fall and son confirms pt has dementia.  He believes she is at baseline mentation currently.  Pt denies having any pain at this moment.  Specifically, no HA, neck pain, back pain, pelvic or hip pain.  She denies feeling faint now.  No SOB or CP.    Patient is a 78 y.o. female presenting with fall. The history is provided by the patient, a relative and the nursing home.  Fall    Past Medical History  Diagnosis Date  . Vascular dementia   . Hypertension   . Transient cerebrovascular ischemia   . Cancer   . Breast cancer     Past Surgical History  Procedure Date  . Joint replacement     History reviewed. No pertinent family history.  History  Substance Use Topics  . Smoking status: Not on file  . Smokeless tobacco: Not on file  . Alcohol Use: No     pt lives in LTCF    OB History    Grav Para Term Preterm Abortions TAB SAB Ect Mult Living                  Review of Systems  Unable to perform ROS: Dementia    Allergies  Penicillins  Home Medications   Current Outpatient Rx  Name Route Sig Dispense Refill  . ACETAMINOPHEN 325 MG PO TABS Oral Take 650 mg by mouth 2 (two) times daily. As needed for pain      . ATORVASTATIN CALCIUM 10 MG PO TABS Oral Take 5 mg by mouth at bedtime.     . CHOLECALCIFEROL 1000 UNITS PO TABS Oral Take 1,000 Units by mouth every morning.     Marland Kitchen CITALOPRAM HYDROBROMIDE 20 MG PO TABS Oral Take 20 mg by mouth daily.     Marland Kitchen CLOPIDOGREL BISULFATE 75 MG PO TABS Oral Take 75 mg by mouth daily.      Marland Kitchen ENSURE  IMMUNE HEALTH PO LIQD Oral Take 237 mLs by mouth daily at 3 pm. At 2 pm    . FERROUS SULFATE 325 (65 FE) MG PO TABS Oral Take 325 mg by mouth daily with breakfast.      . HYDRALAZINE HCL PO Oral Take 75 mg by mouth daily.    Marland Kitchen LABETALOL HCL 200 MG PO TABS Oral Take 200 mg by mouth 2 (two) times daily. At 8 am and 8 pm for blood pressure    . LORAZEPAM 0.5 MG PO TABS Oral Take 0.5 mg by mouth 3 (three) times daily as needed. For agitation or anxiety    . LOSARTAN POTASSIUM 100 MG PO TABS Oral Take 100 mg by mouth daily.      Marland Kitchen MEMANTINE HCL 5 MG PO TABS Oral Take 5 mg by mouth daily. Discontinue on 06/08/11    . RANITIDINE HCL 75 MG PO TABS Oral Take 75 mg by mouth daily.     Marland Kitchen RIVASTIGMINE TARTRATE 3 MG PO CAPS Oral Take 3 mg by mouth every other day. For 2 weeks starting  05/18/11    . TRAMADOL HCL 50 MG PO TABS Oral Take 50 mg by mouth 2 (two) times daily. For pain    . TRIAMTERENE-HCTZ 37.5-25 MG PO TABS Oral Take 0.5 tablets by mouth daily.     Marland Kitchen PRESCRIPTION MEDICATION Topical Apply 1 mL topically every 6 (six) hours as needed. Lorazepam 0.5mg /49ml gel         For agitation and anxiety       BP 168/78  Pulse 70  Temp(Src) 97.5 F (36.4 C) (Oral)  Resp 20  SpO2 97%  Physical Exam  Nursing note and vitals reviewed. Constitutional: She appears well-developed and well-nourished. No distress.  HENT:  Head: Normocephalic and atraumatic.  Eyes: Pupils are equal, round, and reactive to light.  Neck: Normal range of motion. Neck supple. No spinous process tenderness and no muscular tenderness present. Normal range of motion present.  Cardiovascular: Normal rate and regular rhythm.   Pulmonary/Chest: Effort normal and breath sounds normal. She has no wheezes. She has no rales.  Abdominal: Soft. There is no tenderness.  Musculoskeletal: She exhibits no tenderness.       Right hip: Normal. She exhibits no tenderness, no bony tenderness and no deformity.       Left hip: Normal. She exhibits no  tenderness, no bony tenderness and no deformity.  Neurological: She is alert.  Skin: Skin is warm and dry. She is not diaphoretic.    ED Course  Procedures (including critical care time)  Labs Reviewed - No data to display Dg Hip Complete Right  05/25/2011  *RADIOLOGY REPORT*  Clinical Data: Right hip and femoral pain post fall  RIGHT HIP - COMPLETE 2+ VIEW  Comparison: 01/29/2011  Findings: Osseous demineralization. Symmetric hip joints. Right SI joint is slightly less well defined than left, cannot exclude subtle sacroiliitis. Pelvis and proximal right femur appear intact. No definite fracture, dislocation, or bone destruction. Scattered atherosclerotic calcifications. Additional calcifications in the pelvis are likely related to calcified uterine leiomyomata.  IMPRESSION: Osseous demineralization. No definite acute right hip joint abnormalities. Cannot exclude asymmetric right sacroiliitis.  Original Report Authenticated By: Lollie Marrow, M.D.   Dg Femur Right  05/25/2011  *RADIOLOGY REPORT*  Clinical Data: Right femoral and hip pain post fall  RIGHT FEMUR - 2 VIEW  Comparison: 01/29/2011  Findings: Osseous demineralization. Degenerative changes right knee joint with joint space narrowing and spur formation. Scattered atherosclerotic calcifications. No acute fracture, dislocation, or bone destruction.  IMPRESSION: No acute osseous abnormalities.  Original Report Authenticated By: Lollie Marrow, M.D.     1. Fall     I reviewed the above films myself  MDM  Pt with no complaints currently.  Pt normally walks with a walker.  Will try to have her bear weight and ambulate with assistance here.  If she is able, will d/c back to facility as plain films show no acute fracture and low clinical concern for fracture.          Gavin Pound. Oletta Lamas, MD 05/25/11 [id number]

## 2011-07-29 ENCOUNTER — Emergency Department (HOSPITAL_BASED_OUTPATIENT_CLINIC_OR_DEPARTMENT_OTHER)
Admission: EM | Admit: 2011-07-29 | Discharge: 2011-07-29 | Disposition: A | Discharge status: Skilled Nursing Facility | Payer: Medicare Other | Attending: Emergency Medicine | Admitting: Emergency Medicine

## 2011-07-29 ENCOUNTER — Emergency Department (HOSPITAL_BASED_OUTPATIENT_CLINIC_OR_DEPARTMENT_OTHER): Payer: Medicare Other

## 2011-07-29 ENCOUNTER — Encounter (HOSPITAL_BASED_OUTPATIENT_CLINIC_OR_DEPARTMENT_OTHER): Payer: Self-pay | Admitting: *Deleted

## 2011-07-29 DIAGNOSIS — IMO0002 Reserved for concepts with insufficient information to code with codable children: Secondary | ICD-10-CM | POA: Insufficient documentation

## 2011-07-29 DIAGNOSIS — Z79899 Other long term (current) drug therapy: Secondary | ICD-10-CM | POA: Insufficient documentation

## 2011-07-29 DIAGNOSIS — Z86718 Personal history of other venous thrombosis and embolism: Secondary | ICD-10-CM | POA: Insufficient documentation

## 2011-07-29 DIAGNOSIS — I672 Cerebral atherosclerosis: Secondary | ICD-10-CM | POA: Insufficient documentation

## 2011-07-29 DIAGNOSIS — F0150 Vascular dementia without behavioral disturbance: Secondary | ICD-10-CM | POA: Insufficient documentation

## 2011-07-29 DIAGNOSIS — W19XXXA Unspecified fall, initial encounter: Secondary | ICD-10-CM | POA: Insufficient documentation

## 2011-07-29 DIAGNOSIS — R51 Headache: Secondary | ICD-10-CM | POA: Insufficient documentation

## 2011-07-29 DIAGNOSIS — Y9301 Activity, walking, marching and hiking: Secondary | ICD-10-CM | POA: Insufficient documentation

## 2011-07-29 DIAGNOSIS — I10 Essential (primary) hypertension: Secondary | ICD-10-CM | POA: Insufficient documentation

## 2011-07-29 DIAGNOSIS — T148XXA Other injury of unspecified body region, initial encounter: Secondary | ICD-10-CM

## 2011-07-29 MED ORDER — TETANUS-DIPHTH-ACELL PERTUSSIS 5-2.5-18.5 LF-MCG/0.5 IM SUSP
0.5000 mL | Freq: Once | INTRAMUSCULAR | Status: AC
  Administered 2011-07-29: 0.5 mL via INTRAMUSCULAR
  Filled 2011-07-29: qty 0.5

## 2011-07-29 NOTE — ED Notes (Signed)
From [employer] resident fell but doesn't remember the fall per ems. Complaining of bilateral hip knee and leg pain as well as neck pain pt is on LSB with c collar

## 2011-07-29 NOTE — ED Provider Notes (Signed)
History     CSN: [id number]  Arrival date & time 07/29/11  1045   First MD Initiated Contact with Patient 07/29/11 1052      Chief Complaint  Patient presents with  . Fall     Patient is a 78 y.o. female presenting with fall. The history is provided by the patient and the EMS personnel. The history is limited by the condition of the patient.  Fall The accident occurred less than 1 hour ago. The fall occurred while walking. The point of impact was the head. The pain is mild. Associated symptoms include headaches. Pertinent negatives include no abdominal pain. The symptoms are aggravated by activity. Treatment on scene includes a c-collar and a backboard. She has tried rest and immobilization for the symptoms. The treatment provided no relief.  pt is from nursing facility s/p mechanical fall.  Pt has abrasion to forehead.  She has also reported pain in her extremities   Past Medical History  Diagnosis Date  . Vascular dementia   . Hypertension   . Transient cerebrovascular ischemia   . Cancer   . Breast cancer     Past Surgical History  Procedure Date  . Joint replacement     No family history on file.  History  Substance Use Topics  . Smoking status: Not on file  . Smokeless tobacco: Not on file  . Alcohol Use: No     pt lives in [facility]    OB History    Grav Para Term Preterm Abortions TAB SAB Ect Mult Living                  Review of Systems  Unable to perform ROS: Dementia  Gastrointestinal: Negative for abdominal pain.  Neurological: Positive for headaches.    Allergies  Penicillins  Home Medications   Current Outpatient Rx  Name Route Sig Dispense Refill  . ACETAMINOPHEN 325 MG PO TABS Oral Take 650 mg by mouth 2 (two) times daily. As needed for pain      . ATORVASTATIN CALCIUM 10 MG PO TABS Oral Take 5 mg by mouth at bedtime.     . CHOLECALCIFEROL 1000 UNITS PO TABS Oral Take 1,000 Units by mouth every morning.     Marland Kitchen CITALOPRAM HYDROBROMIDE 20 MG  PO TABS Oral Take 20 mg by mouth daily.     Marland Kitchen CLOPIDOGREL BISULFATE 75 MG PO TABS Oral Take 75 mg by mouth daily.      Marland Kitchen ENSURE IMMUNE HEALTH PO LIQD Oral Take 237 mLs by mouth daily at 3 pm. At 2 pm    . FERROUS SULFATE 325 (65 FE) MG PO TABS Oral Take 325 mg by mouth daily with breakfast.      . HYDRALAZINE HCL PO Oral Take 75 mg by mouth daily.    Marland Kitchen LABETALOL HCL 200 MG PO TABS Oral Take 200 mg by mouth 2 (two) times daily. At 8 am and 8 pm for blood pressure    . LORAZEPAM 0.5 MG PO TABS Oral Take 0.5 mg by mouth 3 (three) times daily as needed. For agitation or anxiety    . LOSARTAN POTASSIUM 100 MG PO TABS Oral Take 100 mg by mouth daily.      Marland Kitchen MEMANTINE HCL 5 MG PO TABS Oral Take 5 mg by mouth daily. Discontinue on 06/08/11    . PRESCRIPTION MEDICATION Topical Apply 1 mL topically every 6 (six) hours as needed. Lorazepam 0.5mg /29ml gel  For agitation and anxiety     . RANITIDINE HCL 75 MG PO TABS Oral Take 75 mg by mouth daily.     Marland Kitchen RIVASTIGMINE TARTRATE 3 MG PO CAPS Oral Take 3 mg by mouth every other day. For 2 weeks starting 05/18/11    . TRAMADOL HCL 50 MG PO TABS Oral Take 50 mg by mouth 2 (two) times daily. For pain    . TRIAMTERENE-HCTZ 37.5-25 MG PO TABS Oral Take 0.5 tablets by mouth daily.       BP 122/66  Pulse 62  Temp 98.2 F (36.8 C) (Oral)  Resp 18  SpO2 96%  Physical Exam CONSTITUTIONAL: Well developed/well nourished HEAD AND FACE: forehead abrasion, no other evidence of head trauma EYES: EOMI/PERRL ENMT: Mucous membranes moist NECK: supple no meningeal signs SPINE:entire spine nontender, No bruising/crepitance/stepoffs noted to spine, Patient maintained in spinal precautions/logroll utilized CV: S1/S2 noted, no murmurs/rubs/gallops noted LUNGS: Lungs are clear to auscultation bilaterally, no apparent distress ABDOMEN: soft, nontender, no rebound or guarding GU:no cva tenderness NEURO: Pt is awake/alert, moves all extremitiesx4, pleasantly  demented EXTREMITIES: pulses normal, full ROM, no deformity, mild tenderness with ROM of left hip.  All other extremities/joints palpated/ranged and nontender SKIN: warm, color normal PSYCH: no abnormalities of mood noted  ED Course  Procedures   Labs Reviewed - No data to display Dg Pelvis 1-2 Views  07/29/2011  *RADIOLOGY REPORT*  Clinical Data: Fall, bilateral hip pain  PELVIS - 1-2 VIEW  Comparison: 04/29/2011  Findings: Single frontal view of the pelvis submitted.  No acute fracture or subluxation.  Calcified uterine fibroids within pelvis again noted.  Stable degenerative changes lower lumbar spine. Atherosclerotic calcifications of femoral arteries again noted.  IMPRESSION: No acute fracture or subluxation.  Original Report Authenticated By: Natasha Mead, M.D.   Ct Head Wo Contrast  07/29/2011  *RADIOLOGY REPORT*  Clinical Data:  Status post fall.  CT HEAD WITHOUT CONTRAST CT CERVICAL SPINE WITHOUT CONTRAST  Technique:  Multidetector CT imaging of the head and cervical spine was performed following the standard protocol without intravenous contrast.  Multiplanar CT image reconstructions of the cervical spine were also generated.  Comparison:  Head CT scan 04/29/2011.  CT HEAD  Findings: There is fairly extensive chronic microvascular ischemic change.  Remote left occipital infarct is again seen.  No evidence of acute abnormality including infarction, hemorrhage, mass lesion, mass effect, midline shift or abnormal extra-axial fluid collection is identified.  Imaged paranasal sinuses and mastoid air cells clear.  IMPRESSION: No acute finding.  Stable compared prior exam.  CT CERVICAL SPINE  Findings: There is no fracture or subluxation of the cervical spine.  Degenerative disc disease appears worst at C4-5, C5-6 and C6-7.  Paraspinous soft tissue structures are unremarkable.  Lung apices are clear.  IMPRESSION: No acute finding.  Original Report Authenticated By: Bernadene Bell. Maricela Curet, M.D.   Ct  Cervical Spine Wo Contrast  07/29/2011  *RADIOLOGY REPORT*  Clinical Data:  Status post fall.  CT HEAD WITHOUT CONTRAST CT CERVICAL SPINE WITHOUT CONTRAST  Technique:  Multidetector CT imaging of the head and cervical spine was performed following the standard protocol without intravenous contrast.  Multiplanar CT image reconstructions of the cervical spine were also generated.  Comparison:  Head CT scan 04/29/2011.  CT HEAD  Findings: There is fairly extensive chronic microvascular ischemic change.  Remote left occipital infarct is again seen.  No evidence of acute abnormality including infarction, hemorrhage, mass lesion, mass effect, midline shift or abnormal  extra-axial fluid collection is identified.  Imaged paranasal sinuses and mastoid air cells clear.  IMPRESSION: No acute finding.  Stable compared prior exam.  CT CERVICAL SPINE  Findings: There is no fracture or subluxation of the cervical spine.  Degenerative disc disease appears worst at C4-5, C5-6 and C6-7.  Paraspinous soft tissue structures are unremarkable.  Lung apices are clear.  IMPRESSION: No acute finding.  Original Report Authenticated By: Bernadene Bell. D'ALESSIO, M.D.     1. Fall   2. Abrasion    Pt stable, ambulatory, no acute injury noted, stable for d/c    MDM  Nursing notes including past medical history and social history reviewed and considered in documentation xrays reviewed and considered         Joya Gaskins, MD 07/29/11 1308

## 2011-07-29 NOTE — ED Notes (Signed)
Pt to room 1 by ems, fully immobilized. Pt is at her baseline ms per ems, pleasantly confused, history of dementia noted. Per ems pt had witnessed fall at [facility] hitting her knees and head on a piece of furniture. Quarter sized superficial abrasion noted to center forehead with no active bleeding noted. Pt has moe + x 4 ext, grips are strong. No internal or external rotation noted to bilateral le, no shortening noted, non-tender to palp. Pt denies any pain at this time, ems reports pt was c/o pain to neck, back and bilateral knees on scene.

## 2011-08-10 ENCOUNTER — Encounter (HOSPITAL_BASED_OUTPATIENT_CLINIC_OR_DEPARTMENT_OTHER): Payer: Self-pay | Admitting: *Deleted

## 2011-08-10 ENCOUNTER — Emergency Department (HOSPITAL_BASED_OUTPATIENT_CLINIC_OR_DEPARTMENT_OTHER): Payer: Medicare Other

## 2011-08-10 ENCOUNTER — Emergency Department (HOSPITAL_BASED_OUTPATIENT_CLINIC_OR_DEPARTMENT_OTHER)
Admission: EM | Admit: 2011-08-10 | Discharge: 2011-08-10 | Disposition: A | Discharge status: Skilled Nursing Facility | Payer: Medicare Other | Attending: Emergency Medicine | Admitting: Emergency Medicine

## 2011-08-10 DIAGNOSIS — Y9210 Unspecified residential institution as the place of occurrence of the external cause: Secondary | ICD-10-CM | POA: Insufficient documentation

## 2011-08-10 DIAGNOSIS — W19XXXA Unspecified fall, initial encounter: Secondary | ICD-10-CM | POA: Insufficient documentation

## 2011-08-10 DIAGNOSIS — M25559 Pain in unspecified hip: Secondary | ICD-10-CM | POA: Insufficient documentation

## 2011-08-10 DIAGNOSIS — F0150 Vascular dementia without behavioral disturbance: Secondary | ICD-10-CM | POA: Insufficient documentation

## 2011-08-10 DIAGNOSIS — I672 Cerebral atherosclerosis: Secondary | ICD-10-CM | POA: Insufficient documentation

## 2011-08-10 DIAGNOSIS — M25551 Pain in right hip: Secondary | ICD-10-CM

## 2011-08-10 DIAGNOSIS — I10 Essential (primary) hypertension: Secondary | ICD-10-CM | POA: Insufficient documentation

## 2011-08-10 NOTE — ED Notes (Signed)
Pt to room 10 by ems, per ems pt with right hip pain since fall last week. Legs are = in length without internal or external rotation. Hips are non tender to palp.

## 2011-08-10 NOTE — ED Notes (Signed)
Per [facility], [facility] staff sts pt was c/o right hip pain. [facility] staff sts pt fell 1 wk ago.

## 2011-08-10 NOTE — ED Notes (Signed)
[facility] phoned for transport back to [facility].

## 2011-08-10 NOTE — ED Provider Notes (Signed)
History     CSN: [id number]  Arrival date & time 08/10/11  1000   First MD Initiated Contact with Patient 08/10/11 1015      Chief Complaint  Patient presents with  . Hip Pain    (Consider location/radiation/quality/duration/timing/severity/associated sxs/prior treatment) Patient is a 78 y.o. female presenting with hip pain. The history is provided by the nursing home. The history is limited by the condition of the patient (dementai).  Hip Pain  She was sent by the nursing home where she is residing with report that she is complaining of pain in her right hip. She apparently had fallen about a week ago. Patient is without complaints but has severe dementia.  Past Medical History  Diagnosis Date  . Vascular dementia   . Hypertension   . Transient cerebrovascular ischemia   . Cancer   . Breast cancer     Past Surgical History  Procedure Date  . Joint replacement     No family history on file.  History  Substance Use Topics  . Smoking status: Not on file  . Smokeless tobacco: Not on file  . Alcohol Use: No     pt lives in LTCF    OB History    Grav Para Term Preterm Abortions TAB SAB Ect Mult Living                  Review of Systems  Unable to perform ROS: Dementia    Allergies  Penicillins  Home Medications   Current Outpatient Rx  Name Route Sig Dispense Refill  . ACETAMINOPHEN 325 MG PO TABS Oral Take 650 mg by mouth 2 (two) times daily. As needed for pain      . ATORVASTATIN CALCIUM 10 MG PO TABS Oral Take 5 mg by mouth at bedtime.     . CHOLECALCIFEROL 1000 UNITS PO TABS Oral Take 1,000 Units by mouth every morning.     Marland Kitchen CITALOPRAM HYDROBROMIDE 20 MG PO TABS Oral Take 20 mg by mouth daily.     Marland Kitchen CLOPIDOGREL BISULFATE 75 MG PO TABS Oral Take 75 mg by mouth daily.      Marland Kitchen ENSURE IMMUNE HEALTH PO LIQD Oral Take 237 mLs by mouth daily at 3 pm. At 2 pm    . FERROUS SULFATE 325 (65 FE) MG PO TABS Oral Take 325 mg by mouth daily with breakfast.      .  HYDRALAZINE HCL PO Oral Take 75 mg by mouth daily.    Marland Kitchen LABETALOL HCL 200 MG PO TABS Oral Take 200 mg by mouth 2 (two) times daily. At 8 am and 8 pm for blood pressure    . LORAZEPAM 0.5 MG PO TABS Oral Take 0.5 mg by mouth 3 (three) times daily as needed. For agitation or anxiety    . LOSARTAN POTASSIUM 100 MG PO TABS Oral Take 100 mg by mouth daily.      Marland Kitchen MEMANTINE HCL 5 MG PO TABS Oral Take 5 mg by mouth daily. Discontinue on 06/08/11    . PRESCRIPTION MEDICATION Topical Apply 1 mL topically every 6 (six) hours as needed. Lorazepam 0.5mg /40ml gel         For agitation and anxiety     . RANITIDINE HCL 75 MG PO TABS Oral Take 75 mg by mouth daily.     Marland Kitchen RIVASTIGMINE TARTRATE 3 MG PO CAPS Oral Take 3 mg by mouth every other day. For 2 weeks starting 05/18/11    . TRAMADOL HCL  50 MG PO TABS Oral Take 50 mg by mouth 2 (two) times daily. For pain    . TRIAMTERENE-HCTZ 37.5-25 MG PO TABS Oral Take 0.5 tablets by mouth daily.       BP 193/87  Pulse 64  Temp 98.5 F (36.9 C) (Oral)  Resp 18  Physical Exam  Nursing note and vitals reviewed.  45-year-old female who is resting comfortably and in no acute distress. Vital signs are significant for hypertension with blood pressure 193 recently. Oxygen saturation is 98% which is normal. Head is normocephalic and atraumatic. PERRLA, EOMI. Neck is nontender and supple. Back is nontender. Lungs are clear without rales, wheezes, rhonchi. Heart has a regular rate and rhythm with a 2/6 systolic ejection murmur heard along left sternal border and cardiac apex. Abdomen is soft, flat, nontender without masses or hepatosplenomegaly. Pelvis is nontender. Extremities have no cyanosis or edema. Full passive range of motion is present without pain including full passive range of motion of both hips. There is no tenderness palpation over the hips. Skin is warm and dry without rash. Neurologic: She is awake and alert. She is oriented to person, but not to place or time.  Cranial nerves are intact. There are no motor or sensory deficits.  ED Course  Procedures (including critical care time)  Dg Hip Bilateral W/pelvis  08/10/2011  *RADIOLOGY REPORT*  Clinical Data: Hip pain.  Fall 2 weeks ago.  BILATERAL HIP WITH PELVIS - 4+ VIEW  Comparison: 07/29/2011  Findings: No fracture, subluxation or dislocation.  Mild degenerative changes in the hips and lower lumbar spine.  Calcified uterine fibroids again noted in the pelvis.  Vascular calcifications present.  IMPRESSION: No acute bony abnormality.  Original Report Authenticated By: Cyndie Chime, M.D.   Images viewed by me.  1. Pain in right hip       MDM  Hip pain following fall. Exam is unremarkable but x-rays will be taken. Of note, she had been seen in the emergency department 12 days ago with a fall and had negative pelvis x-ray at that time.  X-rays are negative for fracture. She will need to be treated symptomatically. Since she is 12 days after her original fall, even occult fracture should show up with healing process, so do not feel that she needs advanced imaging such as CT or MRI.      Dione Booze, MD 08/10/11 1124

## 2012-04-28 ENCOUNTER — Emergency Department (HOSPITAL_BASED_OUTPATIENT_CLINIC_OR_DEPARTMENT_OTHER): Payer: Medicare PPO

## 2012-04-28 ENCOUNTER — Emergency Department (HOSPITAL_BASED_OUTPATIENT_CLINIC_OR_DEPARTMENT_OTHER)
Admission: EM | Admit: 2012-04-28 | Discharge: 2012-04-28 | Disposition: A | Discharge status: Home / Self Care | Payer: Medicare PPO | Attending: Emergency Medicine | Admitting: Emergency Medicine

## 2012-04-28 ENCOUNTER — Encounter (HOSPITAL_BASED_OUTPATIENT_CLINIC_OR_DEPARTMENT_OTHER): Payer: Self-pay

## 2012-04-28 ENCOUNTER — Emergency Department (HOSPITAL_BASED_OUTPATIENT_CLINIC_OR_DEPARTMENT_OTHER): Payer: Medicare Other

## 2012-04-28 DIAGNOSIS — Z853 Personal history of malignant neoplasm of breast: Secondary | ICD-10-CM | POA: Insufficient documentation

## 2012-04-28 DIAGNOSIS — Z859 Personal history of malignant neoplasm, unspecified: Secondary | ICD-10-CM | POA: Insufficient documentation

## 2012-04-28 DIAGNOSIS — Z8673 Personal history of transient ischemic attack (TIA), and cerebral infarction without residual deficits: Secondary | ICD-10-CM | POA: Insufficient documentation

## 2012-04-28 DIAGNOSIS — W19XXXA Unspecified fall, initial encounter: Secondary | ICD-10-CM

## 2012-04-28 DIAGNOSIS — W010XXA Fall on same level from slipping, tripping and stumbling without subsequent striking against object, initial encounter: Secondary | ICD-10-CM | POA: Insufficient documentation

## 2012-04-28 DIAGNOSIS — F0390 Unspecified dementia without behavioral disturbance: Secondary | ICD-10-CM | POA: Insufficient documentation

## 2012-04-28 DIAGNOSIS — M25552 Pain in left hip: Secondary | ICD-10-CM

## 2012-04-28 DIAGNOSIS — S79919A Unspecified injury of unspecified hip, initial encounter: Secondary | ICD-10-CM | POA: Insufficient documentation

## 2012-04-28 DIAGNOSIS — Z79899 Other long term (current) drug therapy: Secondary | ICD-10-CM | POA: Insufficient documentation

## 2012-04-28 DIAGNOSIS — Z7901 Long term (current) use of anticoagulants: Secondary | ICD-10-CM | POA: Insufficient documentation

## 2012-04-28 DIAGNOSIS — Y9289 Other specified places as the place of occurrence of the external cause: Secondary | ICD-10-CM | POA: Insufficient documentation

## 2012-04-28 DIAGNOSIS — Y9301 Activity, walking, marching and hiking: Secondary | ICD-10-CM | POA: Insufficient documentation

## 2012-04-28 DIAGNOSIS — I10 Essential (primary) hypertension: Secondary | ICD-10-CM | POA: Insufficient documentation

## 2012-04-28 NOTE — ED Notes (Addendum)
[facility] report-pt is [facility] resident-was walking outside/tripped over rock/fell on right side-witnessed fall-c/o pain to right forehead upon arrival-no LOC-started c/o pain to right thigh after loaded on truck-upon arrival to ED,pt alert to name only-NAD-disoriented to time and place-c/o pain to left forehead-points to area and right hip-swelling noted to left ankle-nontender to palpation

## 2012-04-28 NOTE — ED Provider Notes (Signed)
History     CSN: [id number]  Arrival date & time 04/28/12  2020   First MD Initiated Contact with Patient 04/28/12 2117      Chief Complaint  Patient presents with  . Fall    (Consider location/radiation/quality/duration/timing/severity/associated sxs/prior treatment) HPI Comments: ems reports that pt had a witness fall while walking outside and hit the right side of her body:no loc:ems reports that pt was c/o hip pain  The history is provided by the EMS personnel. The history is limited by the condition of the patient. No language interpreter was used.    Past Medical History  Diagnosis Date  . Vascular dementia   . Hypertension   . Transient cerebrovascular ischemia   . Cancer   . Breast cancer     Past Surgical History  Procedure Laterality Date  . Joint replacement      No family history on file.  History  Substance Use Topics  . Smoking status: Not on file  . Smokeless tobacco: Not on file  . Alcohol Use: No     Comment: pt lives in LTCF    OB History   Grav Para Term Preterm Abortions TAB SAB Ect Mult Living                  Review of Systems  Unable to perform ROS: Dementia  Constitutional: Negative.     Allergies  Penicillins  Home Medications   Current Outpatient Rx  Name  Route  Sig  Dispense  Refill  . acetaminophen (TYLENOL) 325 MG tablet   Oral   Take 650 mg by mouth 2 (two) times daily. As needed for pain           . atorvastatin (LIPITOR) 10 MG tablet   Oral   Take 5 mg by mouth at bedtime.          . Cholecalciferol 1000 UNITS tablet   Oral   Take 1,000 Units by mouth every morning.          . citalopram (CELEXA) 20 MG tablet   Oral   Take 20 mg by mouth daily.          . clopidogrel (PLAVIX) 75 MG tablet   Oral   Take 75 mg by mouth daily.           . feeding supplement (ENSURE IMMUNE HEALTH) LIQD   Oral   Take 237 mLs by mouth daily at 3 pm. At 2 pm         . ferrous sulfate 325 (65 FE) MG tablet  Oral   Take 325 mg by mouth daily with breakfast.           . HYDRALAZINE HCL PO   Oral   Take 75 mg by mouth daily.         Marland Kitchen labetalol (NORMODYNE) 200 MG tablet   Oral   Take 200 mg by mouth 2 (two) times daily. At 8 am and 8 pm for blood pressure         . LORazepam (ATIVAN) 0.5 MG tablet   Oral   Take 0.5 mg by mouth 3 (three) times daily as needed. For agitation or anxiety         . losartan (COZAAR) 100 MG tablet   Oral   Take 100 mg by mouth daily.           . memantine (NAMENDA) 5 MG tablet   Oral   Take 5 mg by  mouth daily. Discontinue on 06/08/11         . PRESCRIPTION MEDICATION   Topical   Apply 1 mL topically every 6 (six) hours as needed. Lorazepam 0.5mg /34ml gel         For agitation and anxiety          . ranitidine (ZANTAC) 75 MG tablet   Oral   Take 75 mg by mouth daily.          . rivastigmine (EXELON) 3 MG capsule   Oral   Take 3 mg by mouth every other day. For 2 weeks starting 05/18/11         . traMADol (ULTRAM) 50 MG tablet   Oral   Take 50 mg by mouth 2 (two) times daily. For pain         . triamterene-hydrochlorothiazide (MAXZIDE-25) 37.5-25 MG per tablet   Oral   Take 0.5 tablets by mouth daily.            BP 142/57  Pulse 69  Temp(Src) 99.1 F (37.3 C) (Oral)  Resp 18  SpO2 99%  Physical Exam  Nursing note and vitals reviewed. Constitutional: She appears well-developed and well-nourished.  HENT:  Head: Normocephalic and atraumatic.  Neck: Normal range of motion. Neck supple.  Cardiovascular: Normal rate and regular rhythm.   Pulmonary/Chest: Effort normal and breath sounds normal.  Abdominal: Soft. Bowel sounds are normal. There is no tenderness.  Musculoskeletal: Normal range of motion.  Pt has tenderness with palpation to the left hip  Neurological: She is alert.  Skin: Skin is warm and dry.    ED Course  Procedures (including critical care time)  Labs Reviewed - No data to display No results  found.   1. Fall, initial encounter   2. Hip pain, left       MDM  Pt left with Dr. Bernette Mayers to evaluate the imaging        Teressa Lower, NP 04/28/12 2209

## 2012-04-29 NOTE — ED Provider Notes (Signed)
Medical screening examination/treatment/procedure(s) were performed by non-physician practitioner and as supervising physician I was immediately available for consultation/collaboration.   Charles B. Sheldon, MD 04/29/12 0923 

## 2013-02-20 ENCOUNTER — Emergency Department (HOSPITAL_BASED_OUTPATIENT_CLINIC_OR_DEPARTMENT_OTHER)
Admission: EM | Admit: 2013-02-20 | Discharge: 2013-02-20 | Disposition: A | Discharge status: Home / Self Care | Payer: BC Managed Care – PPO | Attending: Emergency Medicine | Admitting: Emergency Medicine

## 2013-02-20 ENCOUNTER — Encounter (HOSPITAL_BASED_OUTPATIENT_CLINIC_OR_DEPARTMENT_OTHER): Payer: Self-pay | Admitting: Emergency Medicine

## 2013-02-20 ENCOUNTER — Emergency Department (HOSPITAL_BASED_OUTPATIENT_CLINIC_OR_DEPARTMENT_OTHER): Payer: BC Managed Care – PPO

## 2013-02-20 DIAGNOSIS — I10 Essential (primary) hypertension: Secondary | ICD-10-CM | POA: Insufficient documentation

## 2013-02-20 DIAGNOSIS — R69 Illness, unspecified: Secondary | ICD-10-CM

## 2013-02-20 DIAGNOSIS — Z853 Personal history of malignant neoplasm of breast: Secondary | ICD-10-CM | POA: Insufficient documentation

## 2013-02-20 DIAGNOSIS — R404 Transient alteration of awareness: Secondary | ICD-10-CM | POA: Insufficient documentation

## 2013-02-20 DIAGNOSIS — Z8673 Personal history of transient ischemic attack (TIA), and cerebral infarction without residual deficits: Secondary | ICD-10-CM | POA: Insufficient documentation

## 2013-02-20 DIAGNOSIS — Z79899 Other long term (current) drug therapy: Secondary | ICD-10-CM | POA: Insufficient documentation

## 2013-02-20 DIAGNOSIS — Z880 Allergy status to penicillin: Secondary | ICD-10-CM | POA: Insufficient documentation

## 2013-02-20 DIAGNOSIS — Z7902 Long term (current) use of antithrombotics/antiplatelets: Secondary | ICD-10-CM | POA: Insufficient documentation

## 2013-02-20 DIAGNOSIS — J111 Influenza due to unidentified influenza virus with other respiratory manifestations: Secondary | ICD-10-CM | POA: Insufficient documentation

## 2013-02-20 LAB — CBC WITH DIFFERENTIAL/PLATELET
BASOS ABS: 0 10*3/uL (ref 0.0–0.1)
BASOS PCT: 1 % (ref 0–1)
EOS ABS: 0 10*3/uL (ref 0.0–0.7)
Eosinophils Relative: 0 % (ref 0–5)
HCT: 36 % (ref 36.0–46.0)
HEMOGLOBIN: 11.6 g/dL — AB (ref 12.0–15.0)
Lymphocytes Relative: 16 % (ref 12–46)
Lymphs Abs: 0.9 10*3/uL (ref 0.7–4.0)
MCH: 29.2 pg (ref 26.0–34.0)
MCHC: 32.2 g/dL (ref 30.0–36.0)
MCV: 90.7 fL (ref 78.0–100.0)
MONOS PCT: 14 % — AB (ref 3–12)
Monocytes Absolute: 0.8 10*3/uL (ref 0.1–1.0)
NEUTROS ABS: 3.9 10*3/uL (ref 1.7–7.7)
NEUTROS PCT: 69 % (ref 43–77)
PLATELETS: 189 10*3/uL (ref 150–400)
RBC: 3.97 MIL/uL (ref 3.87–5.11)
RDW: 13.9 % (ref 11.5–15.5)
WBC: 5.6 10*3/uL (ref 4.0–10.5)

## 2013-02-20 LAB — URINALYSIS, ROUTINE W REFLEX MICROSCOPIC
Bilirubin Urine: NEGATIVE
GLUCOSE, UA: NEGATIVE mg/dL
HGB URINE DIPSTICK: NEGATIVE
Ketones, ur: NEGATIVE mg/dL
Nitrite: NEGATIVE
PH: 6 (ref 5.0–8.0)
Protein, ur: NEGATIVE mg/dL
Specific Gravity, Urine: 1.01 (ref 1.005–1.030)
Urobilinogen, UA: 0.2 mg/dL (ref 0.0–1.0)

## 2013-02-20 LAB — POCT I-STAT 3, ART BLOOD GAS (G3+)
Acid-Base Excess: 6 mmol/L — ABNORMAL HIGH (ref 0.0–2.0)
Bicarbonate: 30 mEq/L — ABNORMAL HIGH (ref 20.0–24.0)
O2 Saturation: 99 %
PCO2 ART: 40.6 mmHg (ref 35.0–45.0)
Patient temperature: 100.4
TCO2: 31 mmol/L (ref 0–100)
pH, Arterial: 7.481 — ABNORMAL HIGH (ref 7.350–7.450)
pO2, Arterial: 155 mmHg — ABNORMAL HIGH (ref 80.0–100.0)

## 2013-02-20 LAB — GLUCOSE, CAPILLARY: Glucose-Capillary: 109 mg/dL — ABNORMAL HIGH (ref 70–99)

## 2013-02-20 LAB — COMPREHENSIVE METABOLIC PANEL
ALBUMIN: 3.4 g/dL — AB (ref 3.5–5.2)
ALK PHOS: 51 U/L (ref 39–117)
ALT: 16 U/L (ref 0–35)
AST: 29 U/L (ref 0–37)
BUN: 19 mg/dL (ref 6–23)
CHLORIDE: 96 meq/L (ref 96–112)
CO2: 29 mEq/L (ref 19–32)
Calcium: 9.2 mg/dL (ref 8.4–10.5)
Creatinine, Ser: 1.2 mg/dL — ABNORMAL HIGH (ref 0.50–1.10)
GFR calc Af Amer: 47 mL/min — ABNORMAL LOW (ref >90)
GFR calc non Af Amer: 40 mL/min — ABNORMAL LOW (ref >90)
Glucose, Bld: 110 mg/dL — ABNORMAL HIGH (ref 70–99)
POTASSIUM: 3.9 meq/L (ref 3.7–5.3)
Sodium: 139 mEq/L (ref 137–147)
TOTAL PROTEIN: 7.2 g/dL (ref 6.0–8.3)
Total Bilirubin: 0.4 mg/dL (ref 0.3–1.2)

## 2013-02-20 LAB — CG4 I-STAT (LACTIC ACID)
LACTIC ACID, VENOUS: 0.69 mmol/L (ref 0.5–2.2)
Lactic Acid, Venous: 0.5 mmol/L (ref 0.5–2.2)

## 2013-02-20 LAB — URINE MICROSCOPIC-ADD ON

## 2013-02-20 MED ORDER — DEXTROSE 5 % IV SOLN
1.0000 g | Freq: Once | INTRAVENOUS | Status: AC
  Administered 2013-02-20: 1 g via INTRAVENOUS

## 2013-02-20 MED ORDER — SODIUM CHLORIDE 0.9 % IV BOLUS (SEPSIS)
500.0000 mL | Freq: Once | INTRAVENOUS | Status: AC
  Administered 2013-02-20: 500 mL via INTRAVENOUS

## 2013-02-20 MED ORDER — ACETAMINOPHEN 325 MG PO TABS
650.0000 mg | ORAL_TABLET | Freq: Once | ORAL | Status: AC
  Administered 2013-02-20: 650 mg via ORAL
  Filled 2013-02-20: qty 2

## 2013-02-20 MED ORDER — CEFTRIAXONE SODIUM 1 G IJ SOLR
INTRAMUSCULAR | Status: AC
  Filled 2013-02-20: qty 10

## 2013-02-20 MED ORDER — OSELTAMIVIR PHOSPHATE 75 MG PO CAPS: 75.0000 mg | ORAL_CAPSULE | Freq: Two times a day (BID) | ORAL | Status: DC

## 2013-02-20 MED ORDER — OSELTAMIVIR PHOSPHATE 75 MG PO CAPS
75.0000 mg | ORAL_CAPSULE | Freq: Once | ORAL | Status: AC
  Administered 2013-02-20: 75 mg via ORAL
  Filled 2013-02-20 (×2): qty 1

## 2013-02-20 NOTE — Discharge Instructions (Signed)

## 2013-02-20 NOTE — ED Notes (Signed)
Onset of fever and cough this am per [facility] staff.

## 2013-02-20 NOTE — ED Provider Notes (Addendum)
CSN: [id number]     Arrival date & time 02/20/13  7425 History   First MD Initiated Contact with Patient 02/20/13 0840    Level V caveat to to dementia Chief Complaint  Patient presents with  . Fever  . Influenza   (Consider location/radiation/quality/duration/timing/severity/associated sxs/prior Treatment) HPI 80 year old female sent from memory care unit at [facility] with reports that she has had influenza-like symptoms. The patient Past Medical History  Diagnosis Date  . Vascular dementia   . Hypertension   . Transient cerebrovascular ischemia   . Cancer   . Breast cancer    Past Surgical History  Procedure Laterality Date  . Joint replacement     No family history on file. History  Substance Use Topics  . Smoking status: Unknown If Ever Smoked  . Smokeless tobacco: Not on file  . Alcohol Use: No     Comment: pt lives in LTCF   OB History   Grav Para Term Preterm Abortions TAB SAB Ect Mult Living                 Review of Systems  Unable to perform ROS   Allergies  Penicillins  Home Medications   Current Outpatient Rx  Name  Route  Sig  Dispense  Refill  . acetaminophen (TYLENOL) 325 MG tablet   Oral   Take 650 mg by mouth 2 (two) times daily. As needed for pain           . atorvastatin (LIPITOR) 10 MG tablet   Oral   Take 5 mg by mouth at bedtime.          . Cholecalciferol 1000 UNITS tablet   Oral   Take 1,000 Units by mouth every morning.          . citalopram (CELEXA) 20 MG tablet   Oral   Take 20 mg by mouth daily.          . clopidogrel (PLAVIX) 75 MG tablet   Oral   Take 75 mg by mouth daily.           . feeding supplement (ENSURE IMMUNE HEALTH) LIQD   Oral   Take 237 mLs by mouth daily at 3 pm. At 2 pm         . ferrous sulfate 325 (65 FE) MG tablet   Oral   Take 325 mg by mouth daily with breakfast.           . HYDRALAZINE HCL PO   Oral   Take 75 mg by mouth daily.         Marland Kitchen labetalol (NORMODYNE) 200 MG  tablet   Oral   Take 200 mg by mouth 2 (two) times daily. At 8 am and 8 pm for blood pressure         . LORazepam (ATIVAN) 0.5 MG tablet   Oral   Take 0.5 mg by mouth 3 (three) times daily as needed. For agitation or anxiety         . losartan (COZAAR) 100 MG tablet   Oral   Take 100 mg by mouth daily.           . memantine (NAMENDA) 5 MG tablet   Oral   Take 5 mg by mouth daily. Discontinue on 06/08/11         . PRESCRIPTION MEDICATION   Topical   Apply 1 mL topically every 6 (six) hours as needed. Lorazepam 0.5mg /36ml gel  For agitation and anxiety          . ranitidine (ZANTAC) 75 MG tablet   Oral   Take 75 mg by mouth daily.          . rivastigmine (EXELON) 3 MG capsule   Oral   Take 3 mg by mouth every other day. For 2 weeks starting 05/18/11         . traMADol (ULTRAM) 50 MG tablet   Oral   Take 50 mg by mouth 2 (two) times daily. For pain         . triamterene-hydrochlorothiazide (MAXZIDE-25) 37.5-25 MG per tablet   Oral   Take 0.5 tablets by mouth daily.           BP 129/71  Pulse 69  Temp(Src) 101.4 F (38.6 C) (Rectal)  Resp 16  Wt 140 lb (63.504 kg)  SpO2 94% Physical Exam  Vitals reviewed. Constitutional: She appears well-developed and well-nourished.  HENT:  Head: Normocephalic and atraumatic.  Right Ear: External ear normal.  Left Ear: External ear normal.  Nose: Nose normal.  Mouth/Throat: Oropharynx is clear and moist.  Eyes: Conjunctivae and EOM are normal. Pupils are equal, round, and reactive to light.  Neck: Normal range of motion. Neck supple.  Cardiovascular: Normal rate, regular rhythm, normal heart sounds and intact distal pulses.   Pulmonary/Chest: Effort normal and breath sounds normal.  Abdominal: Soft. Bowel sounds are normal.  Musculoskeletal: Normal range of motion.  Neurological: She is alert. She displays normal reflexes. No cranial nerve deficit.  Patient awake and responds to questions not oriented to  time but oriented to person and "at hospital"  Psychiatric: She has a normal mood and affect. Her behavior is normal. Judgment and thought content normal.    ED Course  Procedures (including critical care time) Labs Review Labs Reviewed  CBC WITH DIFFERENTIAL - Abnormal; Notable for the following:    Hemoglobin 11.6 (*)    Monocytes Relative 14 (*)    All other components within normal limits  COMPREHENSIVE METABOLIC PANEL - Abnormal; Notable for the following:    Glucose, Bld 110 (*)    Creatinine, Ser 1.20 (*)    Albumin 3.4 (*)    GFR calc non Af Amer 40 (*)    GFR calc Af Amer 47 (*)    All other components within normal limits  GLUCOSE, CAPILLARY - Abnormal; Notable for the following:    Glucose-Capillary 109 (*)    All other components within normal limits  URINALYSIS, ROUTINE W REFLEX MICROSCOPIC  CG4 I-STAT (LACTIC ACID)   Imaging Review Dg Chest 2 View  02/20/2013   CLINICAL DATA:  Cough and fever beginning today.  EXAM: CHEST  2 VIEW  COMPARISON:  PA and lateral chest 06/04/2009.  FINDINGS: There is cardiomegaly without edema. Asymmetric elevation of the right hemidiaphragm relative to the left is unchanged. There is a small amount of fluid in the minor fissure. No consolidative process or pneumothorax is identified.  IMPRESSION: Cardiomegaly without acute disease.   Electronically Signed   By: Inge Rise M.D.   On: 02/20/2013 10:02    EKG Interpretation   None       MDM  No diagnosis found. 80 year old female with dementia from memory care unit with known influenza-like illness circulating. The patient appears he had a medically stable and at baseline here. She'll be discharged back on Tamiflu the  11:06 AM RN reports decreased loc.  Patient with bs 106 and now  awakens only to sternal rub.  Sats 94% and temp decreased from first check.  Patient awakens to my verbal stimuli and appears to be at baseline .  She will be started on tamiflu.    Shaune Pollack,  MD 02/20/13 Whatley Cleto Claggett, MD 02/20/13 1344

## 2013-02-20 NOTE — ED Notes (Signed)
Pt not not alert enough to take po meds.

## 2013-03-19 ENCOUNTER — Emergency Department (HOSPITAL_BASED_OUTPATIENT_CLINIC_OR_DEPARTMENT_OTHER)
Admission: EM | Admit: 2013-03-19 | Discharge: 2013-03-19 | Disposition: A | Discharge status: Home / Self Care | Payer: BC Managed Care – PPO | Attending: Emergency Medicine | Admitting: Emergency Medicine

## 2013-03-19 ENCOUNTER — Encounter (HOSPITAL_BASED_OUTPATIENT_CLINIC_OR_DEPARTMENT_OTHER): Payer: Self-pay | Admitting: Emergency Medicine

## 2013-03-19 DIAGNOSIS — F0390 Unspecified dementia without behavioral disturbance: Secondary | ICD-10-CM | POA: Insufficient documentation

## 2013-03-19 DIAGNOSIS — Z853 Personal history of malignant neoplasm of breast: Secondary | ICD-10-CM | POA: Insufficient documentation

## 2013-03-19 DIAGNOSIS — Z880 Allergy status to penicillin: Secondary | ICD-10-CM | POA: Insufficient documentation

## 2013-03-19 DIAGNOSIS — S0993XA Unspecified injury of face, initial encounter: Secondary | ICD-10-CM | POA: Insufficient documentation

## 2013-03-19 DIAGNOSIS — Z79899 Other long term (current) drug therapy: Secondary | ICD-10-CM | POA: Insufficient documentation

## 2013-03-19 DIAGNOSIS — Z7902 Long term (current) use of antithrombotics/antiplatelets: Secondary | ICD-10-CM | POA: Insufficient documentation

## 2013-03-19 DIAGNOSIS — Y939 Activity, unspecified: Secondary | ICD-10-CM | POA: Insufficient documentation

## 2013-03-19 DIAGNOSIS — Y9210 Unspecified residential institution as the place of occurrence of the external cause: Secondary | ICD-10-CM | POA: Insufficient documentation

## 2013-03-19 DIAGNOSIS — S199XXA Unspecified injury of neck, initial encounter: Principal | ICD-10-CM

## 2013-03-19 DIAGNOSIS — I10 Essential (primary) hypertension: Secondary | ICD-10-CM | POA: Insufficient documentation

## 2013-03-19 DIAGNOSIS — R296 Repeated falls: Secondary | ICD-10-CM | POA: Insufficient documentation

## 2013-03-19 DIAGNOSIS — Z8673 Personal history of transient ischemic attack (TIA), and cerebral infarction without residual deficits: Secondary | ICD-10-CM | POA: Insufficient documentation

## 2013-03-19 DIAGNOSIS — W19XXXA Unspecified fall, initial encounter: Secondary | ICD-10-CM

## 2013-03-19 NOTE — ED Notes (Signed)
Pt brought via son from nursing home for fall. Pt was found on the floor at 4:30pm, no obvious injuries, pt alert and pleasant in triage and denies pain.

## 2013-03-19 NOTE — Discharge Instructions (Signed)
As discussed, your evaluation today has been largely reassuring.  But, it is important that you monitor your condition carefully, and do not hesitate to return to the ED if you develop new, or concerning changes in your condition. ? ?Otherwise, please follow-up with your physician for appropriate ongoing care. ? ?

## 2013-03-19 NOTE — ED Provider Notes (Addendum)
CSN: [id number]     Arrival date & time 03/19/13  1813 History  This chart was scribed for Carmin Muskrat, MD by Adriana Reams, ED Scribe. This patient was seen in room MH04/MH04 and the patient's care was started at 2033.    First MD Initiated Contact with Patient 03/19/13 2033     Chief Complaint  Patient presents with  . Fall    The history is provided by the patient, the nursing home and a relative. No language interpreter was used.   HPI Comments: Anne Compton is a 80 y.o. female who presents to the Emergency Department complaining of an unwitnessed fall at her nursing home. Her son states that she was found on the floor at 4:30 pm today. Her son states he noticed a knot on her forehead. She has ambulated since the fall. She denies any new weakness or pain. Her son denies her taking blood thinners.  History of present illness is limited secondary to the patient's dementia, but the patient's son seems to have full details of the event.  Review of systems is also according to the patient's son, level V caveat, given the patient's dementia.   Past Medical History  Diagnosis Date  . Vascular dementia   . Hypertension   . Transient cerebrovascular ischemia   . Cancer   . Breast cancer    Past Surgical History  Procedure Laterality Date  . Joint replacement     No family history on file. History  Substance Use Topics  . Smoking status: Unknown If Ever Smoked  . Smokeless tobacco: Not on file  . Alcohol Use: No     Comment: pt lives in LTCF   OB History   Grav Para Term Preterm Abortions TAB SAB Ect Mult Living                 Review of Systems  Unable to perform ROS: Dementia      Allergies  Penicillins  Home Medications   Current Outpatient Rx  Name  Route  Sig  Dispense  Refill  . acetaminophen (TYLENOL) 325 MG tablet   Oral   Take 650 mg by mouth 2 (two) times daily. As needed for pain           . atorvastatin (LIPITOR) 10 MG tablet   Oral    Take 5 mg by mouth at bedtime.          . Cholecalciferol 1000 UNITS tablet   Oral   Take 1,000 Units by mouth every morning.          . citalopram (CELEXA) 20 MG tablet   Oral   Take 20 mg by mouth daily.          . clopidogrel (PLAVIX) 75 MG tablet   Oral   Take 75 mg by mouth daily.           . feeding supplement (ENSURE IMMUNE HEALTH) LIQD   Oral   Take 237 mLs by mouth daily at 3 pm. At 2 pm         . ferrous sulfate 325 (65 FE) MG tablet   Oral   Take 325 mg by mouth daily with breakfast.           . HYDRALAZINE HCL PO   Oral   Take 75 mg by mouth daily.         Marland Kitchen labetalol (NORMODYNE) 200 MG tablet   Oral   Take 200 mg by mouth  2 (two) times daily. At 8 am and 8 pm for blood pressure         . LORazepam (ATIVAN) 0.5 MG tablet   Oral   Take 0.5 mg by mouth 3 (three) times daily as needed. For agitation or anxiety         . losartan (COZAAR) 100 MG tablet   Oral   Take 100 mg by mouth daily.           . memantine (NAMENDA) 5 MG tablet   Oral   Take 5 mg by mouth daily. Discontinue on 06/08/11         . oseltamivir (TAMIFLU) 75 MG capsule   Oral   Take 1 capsule (75 mg total) by mouth every 12 (twelve) hours.   10 capsule   0   . PRESCRIPTION MEDICATION   Topical   Apply 1 mL topically every 6 (six) hours as needed. Lorazepam 0.5mg /110ml gel         For agitation and anxiety          . ranitidine (ZANTAC) 75 MG tablet   Oral   Take 75 mg by mouth daily.          . rivastigmine (EXELON) 3 MG capsule   Oral   Take 3 mg by mouth every other day. For 2 weeks starting 05/18/11         . traMADol (ULTRAM) 50 MG tablet   Oral   Take 50 mg by mouth 2 (two) times daily. For pain         . triamterene-hydrochlorothiazide (MAXZIDE-25) 37.5-25 MG per tablet   Oral   Take 0.5 tablets by mouth daily.           Triage Vitals; BP 189/71  Pulse 74  Temp(Src) 98.4 F (36.9 C) (Oral)  Resp 18  Wt 140 lb (63.504 kg)  SpO2  98%  Physical Exam  Nursing note and vitals reviewed. Constitutional: She is oriented to person, place, and time. She appears well-developed and well-nourished. No distress.  HENT:  Head: Normocephalic and atraumatic.  Small knot on forehead  Eyes: Conjunctivae and EOM are normal.  Neck:  No midline cervical tenderness  Cardiovascular: Normal rate and regular rhythm.   Pulmonary/Chest: Effort normal and breath sounds normal. No stridor. No respiratory distress.  Abdominal: She exhibits no distension.  Musculoskeletal: She exhibits no edema.  Neurological: She is alert and oriented to person, place, and time. No cranial nerve deficit.  Subjective right hand weakness in grip strength.   Skin: Skin is warm and dry.  Psychiatric: She has a normal mood and affect. Cognition and memory are impaired.    ED Course  Procedures (including critical care time) COORDINATION OF CARE: 8:40 PM Discussed treatment plan with son and patient.    I reviewed the patient's chart.   MDM   Final diagnoses:  Fall    I personally performed the services described in this documentation, which was scribed in my presence. The recorded information has been reviewed and is accurate.  The patient presents several hours after a fall at nursing facility.  Notably, the patient has been ambulatory following a fall, has no obvious injuries, is neurologically intact, though she has some recall difficulties appropriate for dementia.  With reassuring findings, labs, and her history of her fall, imaging was not indicated.  I discussed this with the patient's son.  Patient was discharged in stable condition.   Carmin Muskrat, MD 03/19/13 2055  Carmin Muskrat, MD 03/19/13 2056

## 2013-03-27 ENCOUNTER — Encounter (HOSPITAL_BASED_OUTPATIENT_CLINIC_OR_DEPARTMENT_OTHER): Payer: Self-pay | Admitting: Emergency Medicine

## 2013-03-27 ENCOUNTER — Emergency Department (HOSPITAL_BASED_OUTPATIENT_CLINIC_OR_DEPARTMENT_OTHER)
Admission: EM | Admit: 2013-03-27 | Discharge: 2013-03-27 | Disposition: A | Discharge status: Home / Self Care | Payer: BC Managed Care – PPO | Attending: Emergency Medicine | Admitting: Emergency Medicine

## 2013-03-27 ENCOUNTER — Emergency Department (HOSPITAL_BASED_OUTPATIENT_CLINIC_OR_DEPARTMENT_OTHER): Payer: BC Managed Care – PPO

## 2013-03-27 DIAGNOSIS — W19XXXA Unspecified fall, initial encounter: Secondary | ICD-10-CM

## 2013-03-27 DIAGNOSIS — Y92129 Unspecified place in nursing home as the place of occurrence of the external cause: Secondary | ICD-10-CM

## 2013-03-27 DIAGNOSIS — I10 Essential (primary) hypertension: Secondary | ICD-10-CM | POA: Insufficient documentation

## 2013-03-27 DIAGNOSIS — IMO0002 Reserved for concepts with insufficient information to code with codable children: Secondary | ICD-10-CM | POA: Insufficient documentation

## 2013-03-27 DIAGNOSIS — Z853 Personal history of malignant neoplasm of breast: Secondary | ICD-10-CM | POA: Insufficient documentation

## 2013-03-27 DIAGNOSIS — Z880 Allergy status to penicillin: Secondary | ICD-10-CM | POA: Insufficient documentation

## 2013-03-27 DIAGNOSIS — Z7902 Long term (current) use of antithrombotics/antiplatelets: Secondary | ICD-10-CM | POA: Insufficient documentation

## 2013-03-27 DIAGNOSIS — S99929A Unspecified injury of unspecified foot, initial encounter: Principal | ICD-10-CM

## 2013-03-27 DIAGNOSIS — Z8673 Personal history of transient ischemic attack (TIA), and cerebral infarction without residual deficits: Secondary | ICD-10-CM | POA: Insufficient documentation

## 2013-03-27 DIAGNOSIS — Y9389 Activity, other specified: Secondary | ICD-10-CM | POA: Insufficient documentation

## 2013-03-27 DIAGNOSIS — R296 Repeated falls: Secondary | ICD-10-CM | POA: Insufficient documentation

## 2013-03-27 DIAGNOSIS — S8990XA Unspecified injury of unspecified lower leg, initial encounter: Secondary | ICD-10-CM | POA: Insufficient documentation

## 2013-03-27 DIAGNOSIS — M25561 Pain in right knee: Secondary | ICD-10-CM

## 2013-03-27 DIAGNOSIS — Z79899 Other long term (current) drug therapy: Secondary | ICD-10-CM | POA: Insufficient documentation

## 2013-03-27 DIAGNOSIS — S99919A Unspecified injury of unspecified ankle, initial encounter: Principal | ICD-10-CM

## 2013-03-27 DIAGNOSIS — Y9210 Unspecified residential institution as the place of occurrence of the external cause: Secondary | ICD-10-CM | POA: Insufficient documentation

## 2013-03-27 NOTE — ED Notes (Signed)
EMS reports that the patient had an un witnessed fall at [facility].  C/O right knee pain.  No bruising or swelling.

## 2013-03-27 NOTE — ED Provider Notes (Addendum)
CSN: [id number]     Arrival date & time 03/27/13  1251 History   First MD Initiated Contact with Patient 03/27/13 1306     Chief Complaint  Patient presents with  . Fall     (Consider location/radiation/quality/duration/timing/severity/associated sxs/prior Treatment) Patient is a 80 y.o. female presenting with fall. The history is provided by the EMS personnel and the nursing home. The history is limited by the condition of the patient and the absence of a caregiver.  Fall This is a recurrent problem. The current episode started 1 to 2 hours ago. The problem occurs constantly. The problem has not changed since onset.Associated symptoms comments: Pt c/o of right knee and foot pain. Exacerbated by: unknown. Relieved by: unknown. She has tried nothing for the symptoms.    Past Medical History  Diagnosis Date  . Vascular dementia   . Hypertension   . Transient cerebrovascular ischemia   . Cancer   . Breast cancer    Past Surgical History  Procedure Laterality Date  . Joint replacement     No family history on file. History  Substance Use Topics  . Smoking status: Unknown If Ever Smoked  . Smokeless tobacco: Not on file  . Alcohol Use: No     Comment: pt lives in LTCF   OB History   Grav Para Term Preterm Abortions TAB SAB Ect Mult Living                 Review of Systems  Unable to perform ROS     Allergies  Penicillins  Home Medications   Current Outpatient Rx  Name  Route  Sig  Dispense  Refill  . acetaminophen (TYLENOL) 325 MG tablet   Oral   Take 650 mg by mouth 2 (two) times daily. As needed for pain           . atorvastatin (LIPITOR) 10 MG tablet   Oral   Take 5 mg by mouth at bedtime.          . Cholecalciferol 1000 UNITS tablet   Oral   Take 1,000 Units by mouth every morning.          . clomiPHENE (CLOMID) 50 MG tablet   Oral   Take 25 mg by mouth at bedtime.         . clopidogrel (PLAVIX) 75 MG tablet   Oral   Take 75 mg by mouth  daily.           . feeding supplement (ENSURE IMMUNE HEALTH) LIQD   Oral   Take 237 mLs by mouth daily at 3 pm. At 2 pm         . ferrous sulfate 325 (65 FE) MG tablet   Oral   Take 325 mg by mouth daily with breakfast.           . fluticasone (FLONASE) 50 MCG/ACT nasal spray   Each Nare   Place into both nostrils daily.         Marland Kitchen LORazepam (ATIVAN) 0.5 MG tablet   Oral   Take 0.5 mg by mouth 3 (three) times daily as needed. For agitation or anxiety         . losartan (COZAAR) 100 MG tablet   Oral   Take 100 mg by mouth daily.           . Multiple Vitamin (MULTIVITAMIN) tablet   Oral   Take 1 tablet by mouth daily.         Marland Kitchen  QUEtiapine (SEROQUEL) 25 MG tablet   Oral   Take 12.5 mg by mouth at bedtime.         . ranitidine (ZANTAC) 75 MG tablet   Oral   Take 75 mg by mouth daily.          . citalopram (CELEXA) 20 MG tablet   Oral   Take 20 mg by mouth daily.          Marland Kitchen HYDRALAZINE HCL PO   Oral   Take 75 mg by mouth daily.         Marland Kitchen labetalol (NORMODYNE) 200 MG tablet   Oral   Take 200 mg by mouth 2 (two) times daily. At 8 am and 8 pm for blood pressure         . memantine (NAMENDA) 5 MG tablet   Oral   Take 5 mg by mouth daily. Discontinue on 06/08/11         . oseltamivir (TAMIFLU) 75 MG capsule   Oral   Take 1 capsule (75 mg total) by mouth every 12 (twelve) hours.   10 capsule   0   . PRESCRIPTION MEDICATION   Topical   Apply 1 mL topically every 6 (six) hours as needed. Lorazepam 0.5mg /34ml gel         For agitation and anxiety          . rivastigmine (EXELON) 3 MG capsule   Oral   Take 3 mg by mouth every other day. For 2 weeks starting 05/18/11         . traMADol (ULTRAM) 50 MG tablet   Oral   Take 50 mg by mouth 2 (two) times daily. For pain         . triamterene-hydrochlorothiazide (MAXZIDE-25) 37.5-25 MG per tablet   Oral   Take 0.5 tablets by mouth daily.           BP 177/89  Pulse 78  Temp(Src) 97.9  F (36.6 C) (Oral)  Resp 16  SpO2 98% Physical Exam  Nursing note and vitals reviewed. Constitutional: She appears well-developed and well-nourished. No distress.  HENT:  Head: Normocephalic and atraumatic.  Mouth/Throat: Oropharynx is clear and moist.  Eyes: Conjunctivae and EOM are normal. Pupils are equal, round, and reactive to light.  Neck: Normal range of motion. Neck supple.  Cardiovascular: Normal rate, regular rhythm and intact distal pulses.   No murmur heard. Pulmonary/Chest: Effort normal and breath sounds normal. No respiratory distress. She has no wheezes. She has no rales.  Abdominal: Soft. She exhibits no distension. There is no tenderness. There is no rebound and no guarding.  Musculoskeletal: Normal range of motion. She exhibits edema.       Right knee: She exhibits swelling. She exhibits normal range of motion, no ecchymosis, no deformity and no erythema. Tenderness found. Medial joint line and lateral joint line tenderness noted.       Cervical back: Normal.       Thoracic back: Normal.       Lumbar back: Normal.       Right foot: She exhibits tenderness. She exhibits normal range of motion, no swelling, no crepitus and no deformity.       Feet:  Trace edema of bilateral lower ext.  Bilateral hips ranged without pain  Neurological: She is alert.  alert to person only  Skin: Skin is warm and dry. No rash noted. No erythema.  Psychiatric: She has a normal mood and affect. Her behavior  is normal.    ED Course  Procedures (including critical care time) Labs Review Labs Reviewed - No data to display Imaging Review Dg Knee Complete 4 Views Right  03/27/2013   CLINICAL DATA:  Right knee pain after fall.  EXAM: RIGHT KNEE - COMPLETE 4+ VIEW  COMPARISON:  None.  FINDINGS: No fracture or dislocation is noted. Severe narrowing of the medial joint space is noted with osteophyte formation. Narrowing of the patellofemoral space is also noted with osteophyte formation.  Osteophyte formation is noted laterally as well without significant joint space narrowing. No definite joint effusion is noted.  IMPRESSION: Severe degenerative joint disease is noted. No acute abnormality seen in the right knee.   Electronically Signed   By: Sabino Dick M.D.   On: 03/27/2013 13:38   Dg Foot Complete Right  03/27/2013   CLINICAL DATA:  Fall, right knee and foot pain  EXAM: RIGHT FOOT COMPLETE - 3+ VIEW  COMPARISON:  None  FINDINGS: The soft tissues thickening over the dorsum of the foot. Vascular calcifications noted. No osseous abnormality.  IMPRESSION: Soft tissue thickening.  No osseous abnormality.   Electronically Signed   By: Suzy Bouchard M.D.   On: 03/27/2013 13:59     EKG Interpretation None      MDM   Final diagnoses:  Fall at nursing home  Knee pain, right   Pt with unwitnessed fall at nursing home with c/o of knee pain.  Patient has evidence of some mild swelling of the right knee. She is able to flex the knee to 90 without notable ecchymosis. She is also complaining of pain in her right great toe without any obvious evidence of injury. Is no history of head injury and patient has no head or neck pain. She is at her baseline mental status which is awake and alert but oriented only to person. Plain film of the knee and foot pending. Otherwise no other signs of injury  2:08 PM Plain films neg except for arthritis.  Will d/c home.  Blanchie Dessert, MD 03/27/13 [place], MD 03/27/13 1410

## 2013-05-18 ENCOUNTER — Other Ambulatory Visit (HOSPITAL_COMMUNITY): Payer: Self-pay | Admitting: Geriatric Medicine

## 2013-05-18 DIAGNOSIS — R1310 Dysphagia, unspecified: Secondary | ICD-10-CM

## 2013-05-24 ENCOUNTER — Ambulatory Visit (HOSPITAL_COMMUNITY): Payer: Medicare Other

## 2013-05-31 ENCOUNTER — Ambulatory Visit (HOSPITAL_COMMUNITY)
Admission: RE | Admit: 2013-05-31 | Discharge: 2013-05-31 | Disposition: A | Discharge status: Home / Self Care | Payer: BC Managed Care – PPO | Source: Ambulatory Visit | Attending: Geriatric Medicine | Admitting: Geriatric Medicine

## 2013-05-31 DIAGNOSIS — R1310 Dysphagia, unspecified: Secondary | ICD-10-CM | POA: Insufficient documentation

## 2013-05-31 NOTE — Procedures (Signed)
Objective Swallowing Evaluation: Modified Barium Swallowing Study  Patient Details  Name: Anne Compton MRN: [id number] Date of Birth: 19-May-1927  Today's Date: 05/31/2013 Time: 1125-1140 SLP Time Calculation (min): 15 min  Past Medical History:  Past Medical History  Diagnosis Date  . Vascular dementia   . Hypertension   . Transient cerebrovascular ischemia   . Cancer   . Breast cancer    Past Surgical History:  Past Surgical History  Procedure Laterality Date  . Joint replacement     HPI:  Pt is an 80 year old female arriving for an outpatient MBS. She was referred by SLP working with her in ALF due to concerns that she is aspirating. She is currently ona regular texture diet and thin liquids. She has been coughing with meals, has had increased chest congestion/mucous and is reportedly lethargic with decreased mental status. PMH significant for dementia, depression, edema, GERD.      Assessment / Plan / Recommendation Clinical Impression  Dysphagia Diagnosis: Mild pharyngeal phase dysphagia Clinical impression: Pt demonstrates adequate oral and ororpharyngeal function with only a mild delay in swallow initiation and midly decreased laryngeal closure during the swallow. Pt has consistent trace flash penetration despite bolus size or speed of intake. Suspect that pt has occasional espisodes of frank penetration with resulting cough, though this was not observed during the study. Recommend pt continue a regular diet and thin liquids. SLP at ALF to f/u.     Treatment Recommendation  Defer treatment plan to SLP at (Comment) (home health)    Diet Recommendation Regular;Thin liquid   Liquid Administration via: Cup;Straw Medication Administration: Whole meds with liquid Supervision: Patient able to self feed Postural Changes and/or Swallow Maneuvers: Seated upright 90 degrees    Other  Recommendations     Follow Up Recommendations  Home health SLP    Frequency and Duration         Pertinent Vitals/Pain NA    SLP Swallow Goals     General HPI: Pt is an 80 year old female arriving for an outpatient MBS. She was referred by SLP working with her in ALF due to concerns that she is aspirating. She is currently ona regular texture diet and thin liquids. She has been coughing with meals, has had increased chest congestion/mucous and is reportedly lethargic with decreased mental status. PMH significant for dementia, depression, edema, GERD.  Type of Study: Modified Barium Swallowing Study Reason for Referral: Objectively evaluate swallowing function Previous Swallow Assessment: with Homehealth SLP Diet Prior to this Study: Regular;Thin liquids Temperature Spikes Noted: N/A Respiratory Status: Room air History of Recent Intubation: No Behavior/Cognition: Alert;Cooperative;Pleasant mood;Requires cueing Oral Motor / Sensory Function: Within functional limits Self-Feeding Abilities: Able to feed self Patient Positioning: Upright in chair Volitional Cough: Strong Volitional Swallow: Able to elicit Anatomy: Within functional limits Pharyngeal Secretions: Normal    Reason for Referral Objectively evaluate swallowing function   Oral Phase Oral Preparation/Oral Phase Oral Phase: WFL   Pharyngeal Phase Pharyngeal Phase Pharyngeal Phase: Impaired Pharyngeal - Thin Pharyngeal - Thin Cup: Delayed swallow initiation;Reduced airway/laryngeal closure;Penetration/Aspiration during swallow Penetration/Aspiration details (thin cup): Material enters airway, remains ABOVE vocal cords then ejected out Pharyngeal - Thin Straw: Delayed swallow initiation;Reduced airway/laryngeal closure;Penetration/Aspiration during swallow Penetration/Aspiration details (thin straw): Material enters airway, remains ABOVE vocal cords then ejected out Pharyngeal - Solids Pharyngeal - Puree: Within functional limits Pharyngeal - Regular: Within functional limits Pharyngeal - Pill: Within functional  limits  Cervical Esophageal Phase    GO  Cervical Esophageal Phase Cervical Esophageal Phase: Impaired Cervical Esophageal Phase - Comment Cervical Esophageal Comment: Prominent CP, barium tablet paused at mid esophagus, required several thin boluses to transit to GE junction. No radiologist present to confirm.     Functional Assessment Tool Used: clinical judgement Functional Limitations: Swallowing Swallow Current Status ([id number]): At least 1 percent but less than 20 percent impaired, limited or restricted Swallow Goal Status [phone]): At least 1 percent but less than 20 percent impaired, limited or restricted Swallow Discharge Status [phone]): At least 1 percent but less than 20 percent impaired, limited or restricted   [facility], [place] CCC-SLP [place] Leana [place] 05/31/2013, 12:38 PM

## 2013-09-19 ENCOUNTER — Emergency Department (HOSPITAL_BASED_OUTPATIENT_CLINIC_OR_DEPARTMENT_OTHER)
Admission: EM | Admit: 2013-09-19 | Discharge: 2013-09-20 | Disposition: A | Discharge status: Skilled Nursing Facility | Payer: BC Managed Care – PPO | Attending: Emergency Medicine | Admitting: Emergency Medicine

## 2013-09-19 ENCOUNTER — Encounter (HOSPITAL_BASED_OUTPATIENT_CLINIC_OR_DEPARTMENT_OTHER): Payer: Self-pay | Admitting: Emergency Medicine

## 2013-09-19 ENCOUNTER — Emergency Department (HOSPITAL_BASED_OUTPATIENT_CLINIC_OR_DEPARTMENT_OTHER): Payer: BC Managed Care – PPO

## 2013-09-19 DIAGNOSIS — R296 Repeated falls: Secondary | ICD-10-CM | POA: Insufficient documentation

## 2013-09-19 DIAGNOSIS — F0390 Unspecified dementia without behavioral disturbance: Secondary | ICD-10-CM | POA: Insufficient documentation

## 2013-09-19 DIAGNOSIS — Z853 Personal history of malignant neoplasm of breast: Secondary | ICD-10-CM | POA: Diagnosis not present

## 2013-09-19 DIAGNOSIS — S79929A Unspecified injury of unspecified thigh, initial encounter: Secondary | ICD-10-CM | POA: Diagnosis not present

## 2013-09-19 DIAGNOSIS — Z880 Allergy status to penicillin: Secondary | ICD-10-CM | POA: Insufficient documentation

## 2013-09-19 DIAGNOSIS — Y92129 Unspecified place in nursing home as the place of occurrence of the external cause: Secondary | ICD-10-CM

## 2013-09-19 DIAGNOSIS — Y9210 Unspecified residential institution as the place of occurrence of the external cause: Secondary | ICD-10-CM | POA: Diagnosis not present

## 2013-09-19 DIAGNOSIS — S79919A Unspecified injury of unspecified hip, initial encounter: Secondary | ICD-10-CM | POA: Insufficient documentation

## 2013-09-19 DIAGNOSIS — Y9389 Activity, other specified: Secondary | ICD-10-CM | POA: Insufficient documentation

## 2013-09-19 DIAGNOSIS — Z8673 Personal history of transient ischemic attack (TIA), and cerebral infarction without residual deficits: Secondary | ICD-10-CM | POA: Diagnosis not present

## 2013-09-19 DIAGNOSIS — IMO0002 Reserved for concepts with insufficient information to code with codable children: Secondary | ICD-10-CM | POA: Insufficient documentation

## 2013-09-19 DIAGNOSIS — W19XXXA Unspecified fall, initial encounter: Secondary | ICD-10-CM

## 2013-09-19 DIAGNOSIS — I10 Essential (primary) hypertension: Secondary | ICD-10-CM | POA: Diagnosis not present

## 2013-09-19 DIAGNOSIS — Z79899 Other long term (current) drug therapy: Secondary | ICD-10-CM | POA: Insufficient documentation

## 2013-09-19 NOTE — ED Notes (Signed)
Staff reports that patient has been found on floor twice today, family contacted and refused treatment the first time found on floor, this time family wanted patient evaluated, pt is alert but is in memory care unit [place] , pt denies any problems, c spine cleared by ems

## 2013-09-19 NOTE — ED Notes (Signed)
Patient transported to X-ray 

## 2013-09-19 NOTE — ED Provider Notes (Signed)
CSN: [id number]     Arrival date & time 09/19/13  2121 History  This chart was scribed for Anne Fuel, MD by Martinique Peace, ED Scribe. The patient was seen in [facility]. The patient's care was started at 11:22 PM.    Chief Complaint  Patient presents with  . Fall      HPI HPI Comments: WYONIA FONTANELLA is a 80 y.o. female who presents to the Emergency Department complaining of fall that occurred earlier today. Pt fell two times today and was found on the floor at facility. EMS report that pt is constantly looking for money on the floor and falls trying to pick it up. Pt is on Plavix.    Past Medical History  Diagnosis Date  . Vascular dementia   . Hypertension   . Transient cerebrovascular ischemia   . Cancer   . Breast cancer    Past Surgical History  Procedure Laterality Date  . Joint replacement     History reviewed. No pertinent family history. History  Substance Use Topics  . Smoking status: Unknown If Ever Smoked  . Smokeless tobacco: Not on file  . Alcohol Use: No     Comment: pt lives in LTCF   OB History   Grav Para Term Preterm Abortions TAB SAB Ect Mult Living                 Review of Systems  Unable to perform ROS: Dementia  Level 5 Caveat    Allergies  Penicillins  Home Medications   Prior to Admission medications   Medication Sig Start Date End Date Taking? Authorizing Provider  acetaminophen (TYLENOL) 325 MG tablet Take 650 mg by mouth 2 (two) times daily. As needed for pain     Yes Historical Provider, MD  atorvastatin (LIPITOR) 10 MG tablet Take 5 mg by mouth at bedtime.    Yes Historical Provider, MD  citalopram (CELEXA) 20 MG tablet Take 20 mg by mouth daily.    Yes Historical Provider, MD  clomiPHENE (CLOMID) 50 MG tablet Take 25 mg by mouth at bedtime.   Yes Historical Provider, MD  clopidogrel (PLAVIX) 75 MG tablet Take 75 mg by mouth daily.     Yes Historical Provider, MD  feeding supplement (ENSURE IMMUNE HEALTH) LIQD Take 237 mLs  by mouth daily at 3 pm. At 2 pm   Yes Historical Provider, MD  ferrous sulfate 325 (65 FE) MG tablet Take 325 mg by mouth daily with breakfast.     Yes Historical Provider, MD  fluticasone (FLONASE) 50 MCG/ACT nasal spray Place into both nostrils daily.   Yes Historical Provider, MD  HYDRALAZINE HCL PO Take 75 mg by mouth daily.   Yes Historical Provider, MD  labetalol (NORMODYNE) 200 MG tablet Take 200 mg by mouth 2 (two) times daily. At 8 am and 8 pm for blood pressure   Yes Historical Provider, MD  LORazepam (ATIVAN) 0.5 MG tablet Take 0.5 mg by mouth 3 (three) times daily as needed. For agitation or anxiety   Yes Historical Provider, MD  losartan (COZAAR) 100 MG tablet Take 100 mg by mouth daily.     Yes Historical Provider, MD  memantine (NAMENDA) 5 MG tablet Take 5 mg by mouth daily. Discontinue on 06/08/11   Yes Historical Provider, MD  Multiple Vitamin (MULTIVITAMIN) tablet Take 1 tablet by mouth daily.   Yes Historical Provider, MD  PRESCRIPTION MEDICATION Apply 1 mL topically every 6 (six) hours as needed. Lorazepam 0.5mg /8ml gel  For agitation and anxiety    Yes Historical Provider, MD  QUEtiapine (SEROQUEL) 25 MG tablet Take 12.5 mg by mouth at bedtime.   Yes Historical Provider, MD  ranitidine (ZANTAC) 75 MG tablet Take 75 mg by mouth daily.    Yes Historical Provider, MD  rivastigmine (EXELON) 3 MG capsule Take 3 mg by mouth every other day. For 2 weeks starting 05/18/11   Yes Historical Provider, MD  traMADol (ULTRAM) 50 MG tablet Take 50 mg by mouth 2 (two) times daily. For pain   Yes Historical Provider, MD  triamterene-hydrochlorothiazide (MAXZIDE-25) 37.5-25 MG per tablet Take 0.5 tablets by mouth daily.    Yes Historical Provider, MD  Cholecalciferol 1000 UNITS tablet Take 1,000 Units by mouth every morning.     Historical Provider, MD  oseltamivir (TAMIFLU) 75 MG capsule Take 1 capsule (75 mg total) by mouth every 12 (twelve) hours. 02/20/13   Shaune Pollack, MD   BP  180/81  Pulse 78  Temp(Src) 98.1 F (36.7 C) (Oral)  Resp 22  SpO2 100% Physical Exam  Constitutional: She appears well-developed. No distress.  HENT:  Head: Normocephalic and atraumatic.  Eyes: Conjunctivae and EOM are normal. Pupils are equal, round, and reactive to light.  Neck: No JVD present. No tracheal deviation present.  Cardiovascular: Normal rate, regular rhythm and normal heart sounds.  Exam reveals no gallop.   No murmur heard. Pulmonary/Chest: Breath sounds normal. She has no wheezes. She has no rales. She exhibits tenderness.  Abdominal: Soft. Bowel sounds are normal. She exhibits no distension and no mass. There is no guarding.  Musculoskeletal: Normal range of motion. She exhibits no edema.  Lymphadenopathy:    She has no cervical adenopathy.  Neurological: She is alert. She has normal reflexes. No cranial nerve deficit. Coordination normal.  Oriented to person and place but not time.   Skin: Skin is warm and dry. No rash noted. She is not diaphoretic. No erythema.  2+ pitting edema bilaterally.     ED Course  Procedures (including critical care time)  Imaging Review Dg Pelvis 1-2 Views  09/19/2013   CLINICAL DATA:  [place] twice today.  EXAM: PELVIS - 1-2 VIEW  COMPARISON:  09/05/2013.  FINDINGS: No fracture or dislocation seen. Lower lumbar spine degenerative changes. Calcified uterine fibroids. Arterial calcifications.  IMPRESSION: No fracture or dislocation.   Electronically Signed   By: Enrique Sack M.D.   On: 09/19/2013 23:50   Ct Head Wo Contrast  09/19/2013   CLINICAL DATA:  Falls x2 today. Headaches. History of breast cancer.  EXAM: CT HEAD WITHOUT CONTRAST  CT CERVICAL SPINE WITHOUT CONTRAST  TECHNIQUE: Multidetector CT imaging of the head and cervical spine was performed following the standard protocol without intravenous contrast. Multiplanar CT image reconstructions of the cervical spine were also generated.  COMPARISON:  CT head 09/05/2013. CT head and  cervical spine 04/28/2012.  FINDINGS: CT HEAD FINDINGS  Diffuse cerebral atrophy. Patchy low-attenuation changes throughout the deep white matter consistent with small vessel ischemia. Mild ventricular dilatation consistent with central atrophy. Focal area of encephalomalacia in the left occipital region and in the left cerebellum as well as the right temporal lobe. Changes are consistent with old infarcts. No mass effect or midline shift. No abnormal extra-axial fluid collections. Gray-white matter junctions are distinct. Basal cisterns are not effaced. No evidence of acute intracranial hemorrhage. The basilar artery and intracranial portions of the internal carotid arteries are ectatic with calcification. No depressed skull fractures. Visualized paranasal  sinuses and mastoid air cells are not opacified. No significant change since previous study.  CT CERVICAL SPINE FINDINGS  Diffuse degenerative change throughout the cervical spine with narrowed cervical interspaces and associated endplate hypertrophic changes. Reversal of usual cervical lordosis in the midcervical region is likely degenerative. Prominent disc osteophyte complexes posteriorly at C4-5 and C5-6 levels. Degenerative changes throughout the cervical facet joints with coalition of the posterior elements of C4 and C5 on the right. No vertebral compression deformities. No prevertebral soft tissue swelling. C1-2 articulation appears intact. No focal bone lesion or bone destruction. Bone cortex and trabecular architecture appear intact. Vascular calcifications in the cervical carotid arteries.  IMPRESSION: No acute intracranial abnormalities. Chronic atrophy, small vessel ischemic changes, and old infarcts similar prior study.  Degenerative changes in the cervical spine. No displaced fractures identified.   Electronically Signed   By: Lucienne Capers M.D.   On: 09/19/2013 23:59   Ct Cervical Spine Wo Contrast  09/19/2013   CLINICAL DATA:  Falls x2  today. Headaches. History of breast cancer.  EXAM: CT HEAD WITHOUT CONTRAST  CT CERVICAL SPINE WITHOUT CONTRAST  TECHNIQUE: Multidetector CT imaging of the head and cervical spine was performed following the standard protocol without intravenous contrast. Multiplanar CT image reconstructions of the cervical spine were also generated.  COMPARISON:  CT head 09/05/2013. CT head and cervical spine 04/28/2012.  FINDINGS: CT HEAD FINDINGS  Diffuse cerebral atrophy. Patchy low-attenuation changes throughout the deep white matter consistent with small vessel ischemia. Mild ventricular dilatation consistent with central atrophy. Focal area of encephalomalacia in the left occipital region and in the left cerebellum as well as the right temporal lobe. Changes are consistent with old infarcts. No mass effect or midline shift. No abnormal extra-axial fluid collections. Gray-white matter junctions are distinct. Basal cisterns are not effaced. No evidence of acute intracranial hemorrhage. The basilar artery and intracranial portions of the internal carotid arteries are ectatic with calcification. No depressed skull fractures. Visualized paranasal sinuses and mastoid air cells are not opacified. No significant change since previous study.  CT CERVICAL SPINE FINDINGS  Diffuse degenerative change throughout the cervical spine with narrowed cervical interspaces and associated endplate hypertrophic changes. Reversal of usual cervical lordosis in the midcervical region is likely degenerative. Prominent disc osteophyte complexes posteriorly at C4-5 and C5-6 levels. Degenerative changes throughout the cervical facet joints with coalition of the posterior elements of C4 and C5 on the right. No vertebral compression deformities. No prevertebral soft tissue swelling. C1-2 articulation appears intact. No focal bone lesion or bone destruction. Bone cortex and trabecular architecture appear intact. Vascular calcifications in the cervical  carotid arteries.  IMPRESSION: No acute intracranial abnormalities. Chronic atrophy, small vessel ischemic changes, and old infarcts similar prior study.  Degenerative changes in the cervical spine. No displaced fractures identified.   Electronically Signed   By: Lucienne Capers M.D.   On: 09/19/2013 23:59    11:27 PM- Treatment plan was discussed with patient who verbalizes understanding and agrees.   Level 5 Caveat  MDM   Final diagnoses:  Fall at nursing home, initial encounter    Are without apparent injury. However, patient had complained to nursing of pain in her hip areas so she will be sent for x-rays of her pelvis to rule out significant injury. Also, because she is on clopidogrel, she is at increased risk for bleeding and we sent for CT of head.  X-rays and CTs were unremarkable and she is discharged to return to her nursing  home.  I personally performed the services described in this documentation, which was scribed in my presence. The recorded information has been reviewed and is accurate.     Anne Fuel, MD 34/28/76 [id number]

## 2013-09-19 NOTE — ED Notes (Signed)
Pt found on floor at facility, according to ems pt constantly looking for money on the floor and falls trying to pick it up, pt is on plavix

## 2013-09-20 NOTE — ED Notes (Signed)
[facility] notified for transport 

## 2013-09-20 NOTE — Discharge Instructions (Signed)

## 2013-09-20 NOTE — ED Notes (Signed)
Pt ready for DC.  Attempted to call [facility] to give report. Could only get voice mail.

## 2013-11-11 ENCOUNTER — Encounter (HOSPITAL_BASED_OUTPATIENT_CLINIC_OR_DEPARTMENT_OTHER): Payer: Self-pay | Admitting: Emergency Medicine

## 2013-11-11 ENCOUNTER — Emergency Department (HOSPITAL_BASED_OUTPATIENT_CLINIC_OR_DEPARTMENT_OTHER)
Admission: EM | Admit: 2013-11-11 | Discharge: 2013-11-11 | Disposition: A | Discharge status: Home / Self Care | Payer: Medicare Other | Attending: Emergency Medicine | Admitting: Emergency Medicine

## 2013-11-11 DIAGNOSIS — Z7901 Long term (current) use of anticoagulants: Secondary | ICD-10-CM | POA: Insufficient documentation

## 2013-11-11 DIAGNOSIS — R011 Cardiac murmur, unspecified: Secondary | ICD-10-CM | POA: Diagnosis not present

## 2013-11-11 DIAGNOSIS — Z853 Personal history of malignant neoplasm of breast: Secondary | ICD-10-CM | POA: Insufficient documentation

## 2013-11-11 DIAGNOSIS — Z043 Encounter for examination and observation following other accident: Secondary | ICD-10-CM | POA: Insufficient documentation

## 2013-11-11 DIAGNOSIS — Z79899 Other long term (current) drug therapy: Secondary | ICD-10-CM | POA: Diagnosis not present

## 2013-11-11 DIAGNOSIS — I10 Essential (primary) hypertension: Secondary | ICD-10-CM | POA: Insufficient documentation

## 2013-11-11 DIAGNOSIS — Z7951 Long term (current) use of inhaled steroids: Secondary | ICD-10-CM | POA: Diagnosis not present

## 2013-11-11 DIAGNOSIS — W1830XA Fall on same level, unspecified, initial encounter: Secondary | ICD-10-CM | POA: Insufficient documentation

## 2013-11-11 DIAGNOSIS — Z880 Allergy status to penicillin: Secondary | ICD-10-CM | POA: Insufficient documentation

## 2013-11-11 DIAGNOSIS — F0390 Unspecified dementia without behavioral disturbance: Secondary | ICD-10-CM | POA: Diagnosis not present

## 2013-11-11 DIAGNOSIS — Y92129 Unspecified place in nursing home as the place of occurrence of the external cause: Secondary | ICD-10-CM

## 2013-11-11 DIAGNOSIS — W19XXXA Unspecified fall, initial encounter: Secondary | ICD-10-CM

## 2013-11-11 DIAGNOSIS — Y9389 Activity, other specified: Secondary | ICD-10-CM | POA: Diagnosis not present

## 2013-11-11 DIAGNOSIS — Y92238 Other place in hospital as the place of occurrence of the external cause: Secondary | ICD-10-CM | POA: Insufficient documentation

## 2013-11-11 NOTE — ED Notes (Signed)
Patient found on floor of nursing facility this morning, c/o L shoulder pain, placed in c-collar by ems

## 2013-11-11 NOTE — ED Provider Notes (Signed)
CSN: [id number]     Arrival date & time 11/11/13  0756 History   First MD Initiated Contact with Patient 11/11/13 0801     Chief Complaint  Patient presents with  . Fall     (Consider location/radiation/quality/duration/timing/severity/associated sxs/prior Treatment) HPI 80 year old female with history of dementia and history of multiple falls previously was found on the floor next to her bed at the nursing home this morning after a possible fall, apparently patient might have had some transient left shoulder pain which is now resolved, patient now denies pain, patient is oriented to person only, patient denies any complaints, patient denies headache neck pain back pain chest pain shortness of breath abdominal pain or pain to her arms or legs or new weakness or numbness to her arms or legs. EMS reports patient is acting per her baseline according to nursing home staff. Since there was a possible fall the patient was sent to the emergency department. Past Medical History  Diagnosis Date  . Vascular dementia   . Hypertension   . Transient cerebrovascular ischemia   . Cancer   . Breast cancer    Past Surgical History  Procedure Laterality Date  . Joint replacement     No family history on file. History  Substance Use Topics  . Smoking status: Unknown If Ever Smoked  . Smokeless tobacco: Not on file  . Alcohol Use: No     Comment: pt lives in LTCF   OB History    No data available     Review of Systems  Unable to perform ROS: Dementia      Allergies  Penicillins  Home Medications   Prior to Admission medications   Medication Sig Start Date End Date Taking? Authorizing Provider  acetaminophen (TYLENOL) 325 MG tablet Take 650 mg by mouth 2 (two) times daily. As needed for pain      Historical Provider, MD  atorvastatin (LIPITOR) 10 MG tablet Take 5 mg by mouth at bedtime.     Historical Provider, MD  Cholecalciferol 1000 UNITS tablet Take 1,000 Units by mouth every  morning.     Historical Provider, MD  citalopram (CELEXA) 20 MG tablet Take 20 mg by mouth daily.     Historical Provider, MD  clomiPHENE (CLOMID) 50 MG tablet Take 25 mg by mouth at bedtime.    Historical Provider, MD  clopidogrel (PLAVIX) 75 MG tablet Take 75 mg by mouth daily.      Historical Provider, MD  feeding supplement (ENSURE IMMUNE HEALTH) LIQD Take 237 mLs by mouth daily at 3 pm. At 2 pm    Historical Provider, MD  ferrous sulfate 325 (65 FE) MG tablet Take 325 mg by mouth daily with breakfast.      Historical Provider, MD  fluticasone (FLONASE) 50 MCG/ACT nasal spray Place into both nostrils daily.    Historical Provider, MD  HYDRALAZINE HCL PO Take 75 mg by mouth daily.    Historical Provider, MD  labetalol (NORMODYNE) 200 MG tablet Take 200 mg by mouth 2 (two) times daily. At 8 am and 8 pm for blood pressure    Historical Provider, MD  LORazepam (ATIVAN) 0.5 MG tablet Take 0.5 mg by mouth 3 (three) times daily as needed. For agitation or anxiety    Historical Provider, MD  losartan (COZAAR) 100 MG tablet Take 100 mg by mouth daily.      Historical Provider, MD  memantine (NAMENDA) 5 MG tablet Take 5 mg by mouth daily. Discontinue on 06/08/11  Historical Provider, MD  Multiple Vitamin (MULTIVITAMIN) tablet Take 1 tablet by mouth daily.    Historical Provider, MD  oseltamivir (TAMIFLU) 75 MG capsule Take 1 capsule (75 mg total) by mouth every 12 (twelve) hours. 02/20/13   Shaune Pollack, MD  PRESCRIPTION MEDICATION Apply 1 mL topically every 6 (six) hours as needed. Lorazepam 0.5mg /52ml gel         For agitation and anxiety     Historical Provider, MD  QUEtiapine (SEROQUEL) 25 MG tablet Take 12.5 mg by mouth at bedtime.    Historical Provider, MD  ranitidine (ZANTAC) 75 MG tablet Take 75 mg by mouth daily.     Historical Provider, MD  rivastigmine (EXELON) 3 MG capsule Take 3 mg by mouth every other day. For 2 weeks starting 05/18/11    Historical Provider, MD  traMADol (ULTRAM) 50 MG  tablet Take 50 mg by mouth 2 (two) times daily. For pain    Historical Provider, MD  triamterene-hydrochlorothiazide (MAXZIDE-25) 37.5-25 MG per tablet Take 0.5 tablets by mouth daily.     Historical Provider, MD   BP 213/90 mmHg  Pulse 64  Temp(Src) 97.8 F (36.6 C) (Oral)  Resp 18  SpO2 99% Physical Exam  Nursing note and vitals reviewed. Constitutional:  Awake, alert, nontoxic appearance with baseline speech for patient.  HENT:  Head: Atraumatic.  Mouth/Throat: No oropharyngeal exudate.  Eyes: EOM are normal. Pupils are equal, round, and reactive to light. Right eye exhibits no discharge. Left eye exhibits no discharge.  Neck: Neck supple.  Cervical spine nontender. Back nontender.  Cardiovascular: Normal rate and regular rhythm.   Murmur heard. Soft systolic murmur  Pulmonary/Chest: Effort normal and breath sounds normal. No stridor. No respiratory distress. She has no wheezes. She has no rales. She exhibits no tenderness.  Pulse oximetry normal room air 99%  Abdominal: Soft. Bowel sounds are normal. She exhibits no mass. There is no tenderness. There is no rebound.  Musculoskeletal: She exhibits no tenderness.  Baseline ROM, moves extremities with no obvious new focal weakness. Both arms and both legs are nontender with good passive and active movement without pain.  Lymphadenopathy:    She has no cervical adenopathy.  Neurological: She is alert.  Awake, alert, cooperative, pleasant, oriented to person only; motor strength 4/5 bilaterally; sensation normal to light touch bilaterally; peripheral visual fields full to confrontation; no facial asymmetry; tongue midline; major cranial nerves appear intact; no pronator drift arms, normal finger to nose bilaterally, patient able to stand at bedside with assistance without pain.  Skin: No rash noted.  Psychiatric: She has a normal mood and affect.    ED Course  Procedures (including critical care time) Labs Review Labs Reviewed -  No data to display  Imaging Review No results found. No results found.  EKG Interpretation None      MDM   Final diagnoses:  Fall at nursing home, initial encounter  Dementia, without behavioral disturbance    I doubt any other EMC precluding discharge at this time including, but not necessarily limited to the following: intracranial hemorrhage requiring emergency surgery, spinal cord syndrome.    Babette Relic, MD 11/25/13 [phone]

## 2013-11-11 NOTE — Discharge Instructions (Signed)
Dementia °Dementia is a word that is used to describe problems with the brain and how it works. People with dementia have memory loss. They may also have problems with thinking, speaking, or solving problems. It can affect how they act around people, how they do their job, their mood, and their personality. These changes may not show up for a long time. Family or friends may not notice problems in the early part of this disease. °HOME CARE °The following tips are for the person living with, or caring for, the person with dementia. °Make the home safe. °· Remove locks on bathroom doors. °· Use childproof locks on cabinets where alcohol, cleaning supplies, or chemicals are stored. °· Put outlet covers in electrical outlets. °· Put in childproof locks to keep doors and windows safe. °· Remove stove knobs, or put in safety knobs that shut off on their own. °· Lower the temperature on water heaters. °· Label medicines. Lock them in a safe place. °· Keep knives, lighters, matches, power tools, and guns out of reach or in a safe place. °· Remove objects that might break or can hurt the person. °· Make sure lighting is good inside and outside. °· Put in grab bars if needed. °· Use a device that detects falls or other needs for help. °Lessen confusion. °· Keep familiar objects and people around. °· Use night lights or low lit (dim) lights at night. °· Label objects or areas. °· Use reminders, notes, or directions for daily activities or tasks. °· Keep a simple routine that is the same for waking, meals, bathing, dressing, and bedtime. °· Create a calm and quiet home. °· Put up clocks and calendars. °· Keep emergency numbers and the home address near all phones. °· Help show the different times of day. Open the curtains during the day to let light in. °Speak clearly and directly. °· Choose simple words and short sentences. °· Use a gentle, calm voice. °· Do not interrupt. °· If the person has a hard time finding a word to  use, give them the word or thought. °· Ask 1 question at a time. Give enough time for the person to answer. Repeat the question if the person does not answer. °Do things that lessen restlessness. °· Provide a comfortable bed. °· Have the same bedtime routine every night. °· Have a regular walking and activity schedule. °· Lessen naps during the day. °· Do not let the person drink a lot of caffeine. °· Go to events that are not overwhelming. °Eat well and drink fluids. °· Lessen distractions during meal times and snacks. °· Avoid foods that are too hot or too cold. °· Watch how the person chews and swallows. This is to make sure they do not choke. °Other °· Keep all vision, hearing, dental, and medical visits with the doctor. °· Only give medicines as told by the doctor. °· Watch the person's driving ability. Do not let the person drive if he or she cannot drive safely. °· Use a program that helps find a person if they become missing. You may need to register with this program. °GET HELP RIGHT AWAY IF:  °· A fever of 102° F (38.9° C) develops. °· Confusion develops or gets worse. °· Sleepiness develops or gets worse. °· Staying awake is hard to do. °· New behavior problems start like mood swings, aggression, and seeing things that are not there. °· Problems with balance, speech, or falling develop. °· Problems swallowing develop. °· Any   problems of another sickness develop. MAKE SURE YOU:  Understand these instructions.  Will watch his or her condition.  Will get help right away if he or she is not doing well or gets worse. Document Released: 12/25/2007 Document Revised: 04/05/2011 Document Reviewed: 06/08/2010 [facility] Patient Information 2015 [place], [place]. This information is not intended to replace advice given to you by your health care provider. Make sure you discuss any questions you have with your health care provider.  Fall Prevention and Home Safety Falls cause injuries and can affect all age  groups. It is possible to prevent falls.  HOW TO PREVENT FALLS  Wear shoes with rubber soles that do not have an opening for your toes.  Keep the inside and outside of your house well lit.  Use night lights throughout your home.  Remove clutter from floors.  Clean up floor spills.  Remove throw rugs or fasten them to the floor with carpet tape.  Do not place electrical cords across pathways.  Put grab bars by your tub, shower, and toilet. Do not use towel bars as grab bars.  Put handrails on both sides of the stairway. Fix loose handrails.  Do not climb on stools or stepladders, if possible.  Do not wax your floors.  Repair uneven or unsafe sidewalks, walkways, or stairs.  Keep items you use a lot within reach.  Be aware of pets.  Keep emergency numbers next to the telephone.  Put smoke detectors in your home and near bedrooms. Ask your doctor what other things you can do to prevent falls. Document Released: 11/07/2008 Document Revised: 07/13/2011 Document Reviewed: 04/13/2011 [facility] Patient Information 2015 [place], [place]. This information is not intended to replace advice given to you by your health care provider. Make sure you discuss any questions you have with your health care provider.  Not every illness or injury can be identified during an emergency department visit, thus follow-up with your primary healthcare provider is important. Medical conditions can also worsen, so it is also important to return immediately as directed below, or if you have other serious concerns develop. RETURN IMMEDIATELY IF you develop new shortness of breath, chest pain, fever, have pain and/or difficulty moving parts of your body (new weakness, numbness, or incoordination), sudden change in speech, vision, swallowing, or understanding, faint or develop new dizziness, severe headache, become poorly responsive or have an altered mental status compared to baseline for you, new rash, abdominal  pain, or bloody stools,  Return sooner also if you develop new problems for which you have not talked to your caregiver but you feel may be emergency medical conditions, or are unable to be cared for safely at home.

## 2014-02-06 ENCOUNTER — Emergency Department (HOSPITAL_BASED_OUTPATIENT_CLINIC_OR_DEPARTMENT_OTHER): Payer: Medicare PPO

## 2014-02-06 ENCOUNTER — Encounter (HOSPITAL_BASED_OUTPATIENT_CLINIC_OR_DEPARTMENT_OTHER): Payer: Self-pay | Admitting: *Deleted

## 2014-02-06 ENCOUNTER — Emergency Department (HOSPITAL_BASED_OUTPATIENT_CLINIC_OR_DEPARTMENT_OTHER)
Admission: EM | Admit: 2014-02-06 | Discharge: 2014-02-06 | Disposition: A | Discharge status: Home / Self Care | Payer: Medicare PPO | Attending: Emergency Medicine | Admitting: Emergency Medicine

## 2014-02-06 DIAGNOSIS — Y9389 Activity, other specified: Secondary | ICD-10-CM | POA: Insufficient documentation

## 2014-02-06 DIAGNOSIS — S0990XA Unspecified injury of head, initial encounter: Secondary | ICD-10-CM | POA: Diagnosis present

## 2014-02-06 DIAGNOSIS — Z8673 Personal history of transient ischemic attack (TIA), and cerebral infarction without residual deficits: Secondary | ICD-10-CM | POA: Diagnosis not present

## 2014-02-06 DIAGNOSIS — Z7902 Long term (current) use of antithrombotics/antiplatelets: Secondary | ICD-10-CM | POA: Insufficient documentation

## 2014-02-06 DIAGNOSIS — Z880 Allergy status to penicillin: Secondary | ICD-10-CM | POA: Diagnosis not present

## 2014-02-06 DIAGNOSIS — Y998 Other external cause status: Secondary | ICD-10-CM | POA: Diagnosis not present

## 2014-02-06 DIAGNOSIS — Z7951 Long term (current) use of inhaled steroids: Secondary | ICD-10-CM | POA: Diagnosis not present

## 2014-02-06 DIAGNOSIS — W19XXXA Unspecified fall, initial encounter: Secondary | ICD-10-CM | POA: Diagnosis not present

## 2014-02-06 DIAGNOSIS — Z853 Personal history of malignant neoplasm of breast: Secondary | ICD-10-CM | POA: Insufficient documentation

## 2014-02-06 DIAGNOSIS — Y9289 Other specified places as the place of occurrence of the external cause: Secondary | ICD-10-CM | POA: Insufficient documentation

## 2014-02-06 DIAGNOSIS — T148XXA Other injury of unspecified body region, initial encounter: Secondary | ICD-10-CM

## 2014-02-06 DIAGNOSIS — S0083XA Contusion of other part of head, initial encounter: Secondary | ICD-10-CM | POA: Diagnosis not present

## 2014-02-06 DIAGNOSIS — I10 Essential (primary) hypertension: Secondary | ICD-10-CM | POA: Insufficient documentation

## 2014-02-06 DIAGNOSIS — F0150 Vascular dementia without behavioral disturbance: Secondary | ICD-10-CM | POA: Insufficient documentation

## 2014-02-06 DIAGNOSIS — Z79899 Other long term (current) drug therapy: Secondary | ICD-10-CM | POA: Diagnosis not present

## 2014-02-06 HISTORY — DX: Cerebral infarction, unspecified: I63.9

## 2014-02-06 NOTE — ED Provider Notes (Signed)
CSN: [id number]     Arrival date & time 02/06/14  1514 History   First MD Initiated Contact with Patient 02/06/14 1517     Chief Complaint  Patient presents with  . Fall     (Consider location/radiation/quality/duration/timing/severity/associated sxs/prior Treatment) HPI Comments: Patient brought to the emergency department for evaluation after unwitnessed fall. Patient found on the ground, having fallen on a carpeted floor. She initially had no complaints, but then started to complain of either right or left hip pain, it is unclear which one. By time of arrival to the ER, she was no longer complaining of hip pain. She now reports left foot and right ankle pain. She has a slight headache. Patient does have dementia, is a poor historian. Level V Caveat due to dementia.  Patient is a 81 y.o. female presenting with fall.  Fall    Past Medical History  Diagnosis Date  . Vascular dementia   . Hypertension   . Transient cerebrovascular ischemia   . Cancer   . Breast cancer   . Stroke     TIA   Past Surgical History  Procedure Laterality Date  . Joint replacement     No family history on file. History  Substance Use Topics  . Smoking status: Unknown If Ever Smoked  . Smokeless tobacco: Not on file  . Alcohol Use: No     Comment: pt lives in LTCF   OB History    No data available     Review of Systems  Unable to perform ROS: Dementia      Allergies  Penicillins  Home Medications   Prior to Admission medications   Medication Sig Start Date End Date Taking? Authorizing Provider  donepezil (ARICEPT) 10 MG tablet Take 10 mg by mouth at bedtime.   Yes Historical Provider, MD  acetaminophen (TYLENOL) 325 MG tablet Take 650 mg by mouth 2 (two) times daily. As needed for pain      Historical Provider, MD  atorvastatin (LIPITOR) 10 MG tablet Take 5 mg by mouth at bedtime.     Historical Provider, MD  Cholecalciferol 1000 UNITS tablet Take 1,000 Units by mouth every  morning.     Historical Provider, MD  citalopram (CELEXA) 20 MG tablet Take 20 mg by mouth daily.     Historical Provider, MD  clomiPHENE (CLOMID) 50 MG tablet Take 25 mg by mouth at bedtime.    Historical Provider, MD  clopidogrel (PLAVIX) 75 MG tablet Take 75 mg by mouth daily.      Historical Provider, MD  feeding supplement (ENSURE IMMUNE HEALTH) LIQD Take 237 mLs by mouth daily at 3 pm. At 2 pm    Historical Provider, MD  ferrous sulfate 325 (65 FE) MG tablet Take 325 mg by mouth daily with breakfast.      Historical Provider, MD  fluticasone (FLONASE) 50 MCG/ACT nasal spray Place into both nostrils daily.    Historical Provider, MD  HYDRALAZINE HCL PO Take 75 mg by mouth daily.    Historical Provider, MD  labetalol (NORMODYNE) 200 MG tablet Take 200 mg by mouth 2 (two) times daily. At 8 am and 8 pm for blood pressure    Historical Provider, MD  LORazepam (ATIVAN) 0.5 MG tablet Take 0.5 mg by mouth 3 (three) times daily as needed. For agitation or anxiety    Historical Provider, MD  losartan (COZAAR) 100 MG tablet Take 100 mg by mouth daily.      Historical Provider, MD  memantine (  NAMENDA) 5 MG tablet Take 5 mg by mouth daily. Discontinue on 06/08/11    Historical Provider, MD  Multiple Vitamin (MULTIVITAMIN) tablet Take 1 tablet by mouth daily.    Historical Provider, MD  oseltamivir (TAMIFLU) 75 MG capsule Take 1 capsule (75 mg total) by mouth every 12 (twelve) hours. 02/20/13   Shaune Pollack, MD  PRESCRIPTION MEDICATION Apply 1 mL topically every 6 (six) hours as needed. Lorazepam 0.5mg /78ml gel         For agitation and anxiety     Historical Provider, MD  QUEtiapine (SEROQUEL) 25 MG tablet Take 12.5 mg by mouth at bedtime.    Historical Provider, MD  ranitidine (ZANTAC) 75 MG tablet Take 75 mg by mouth daily.     Historical Provider, MD  rivastigmine (EXELON) 3 MG capsule Take 3 mg by mouth every other day. For 2 weeks starting 05/18/11    Historical Provider, MD  traMADol (ULTRAM) 50 MG  tablet Take 50 mg by mouth 2 (two) times daily. For pain    Historical Provider, MD  triamterene-hydrochlorothiazide (MAXZIDE-25) 37.5-25 MG per tablet Take 0.5 tablets by mouth daily.     Historical Provider, MD   BP 167/87 mmHg  Pulse 72  Temp(Src) 98.1 F (36.7 C)  Resp 20  Wt 140 lb (63.504 kg)  SpO2 100% Physical Exam  Constitutional: She appears well-developed and well-nourished. No distress.  HENT:  Head: Normocephalic and atraumatic.  Right Ear: Hearing normal.  Left Ear: Hearing normal.  Nose: Nose normal.  Mouth/Throat: Oropharynx is clear and moist and mucous membranes are normal.  Eyes: Conjunctivae and EOM are normal. Pupils are equal, round, and reactive to light.  Neck: Normal range of motion. Neck supple.  Cardiovascular: Regular rhythm, S1 normal and S2 normal.  Exam reveals no gallop and no friction rub.   No murmur heard. Pulmonary/Chest: Effort normal and breath sounds normal. No respiratory distress. She exhibits no tenderness.  Abdominal: Soft. Normal appearance and bowel sounds are normal. There is no hepatosplenomegaly. There is no tenderness. There is no rebound, no guarding, no tenderness at McBurney's point and negative Murphy's sign. No hernia.  Musculoskeletal: Normal range of motion.  Neurological: She is alert. She has normal strength. She is disoriented. No cranial nerve deficit or sensory deficit. Coordination normal. GCS eye subscore is 4. GCS verbal subscore is 5. GCS motor subscore is 6.  Skin: Skin is warm, dry and intact. No rash noted. No cyanosis.  Psychiatric: She has a normal mood and affect. Her speech is normal and behavior is normal. Thought content normal.  Nursing note and vitals reviewed.   ED Course  Procedures (including critical care time) Labs Review Labs Reviewed - No data to display  Imaging Review Dg Ankle Complete Right  02/06/2014   CLINICAL DATA:  Unwitnessed fall today.  Right hip pain.  EXAM: RIGHT ANKLE - COMPLETE 3+  VIEW  COMPARISON:  None.  FINDINGS: There is no evidence of fracture, dislocation, or joint effusion. There is mild soft tissue swelling of lateral ankle. Minimal plantar calcaneal spur is noted.  IMPRESSION: No acute fracture or dislocation. Mild soft tissue swelling of lateral ankle.   Electronically Signed   By: Abelardo Diesel M.D.   On: 02/06/2014 16:28   Ct Head Wo Contrast  02/06/2014   CLINICAL DATA:  Unwitnessed fall today. Head injury. Unknown loss of consciousness. History of hypertension and dementia.  EXAM: CT HEAD WITHOUT CONTRAST  CT CERVICAL SPINE WITHOUT CONTRAST  TECHNIQUE:  Multidetector CT imaging of the head and cervical spine was performed following the standard protocol without intravenous contrast. Multiplanar CT image reconstructions of the cervical spine were also generated.  COMPARISON:  Head CT 01/23/2014.  Cervical spine CT 09/19/2013.  FINDINGS: CT HEAD FINDINGS  There is mild or moderate cerebral atrophy, unchanged. Chronic left occipital infarct is unchanged. Patchy hypodensities in the subcortical and deep cerebral white matter are similar to the prior CT and nonspecific but compatible with moderate chronic small vessel ischemic disease. There is no evidence of acute cortical infarct, intracranial hemorrhage, mass, midline shift, or extra-axial fluid collection.  Prior bilateral cataract extraction is noted. Mastoid air cells and visualized paranasal sinuses are clear. No acute skull fracture is identified  CT CERVICAL SPINE FINDINGS  Straightening of the cervical spine is unchanged with slight anterolisthesis of C4 on C5. Severe disc space narrowing is again seen from C4-5 to C6-7 with associated degenerative endplate osteophytosis. Facet ankylosis is again seen on the right at C4-5. Moderate to severe multilevel facet arthrosis is present, worse on the right. No acute cervical spine fracture is identified. Medialization of the common carotid arteries is noted. Visualized lung  apices are clear. Thyroid gland is mildly heterogeneous diffusely without discrete, sizable nodule identified.  IMPRESSION: 1. No evidence of acute intracranial abnormality. 2. Moderate chronic small vessel ischemic disease and chronic left occipital infarct. 3. No acute osseous abnormality identified in the cervical spine. Advanced spondylosis.   Electronically Signed   By: Logan Bores   On: 02/06/2014 16:01   Ct Cervical Spine Wo Contrast  02/06/2014   CLINICAL DATA:  Unwitnessed fall today. Head injury. Unknown loss of consciousness. History of hypertension and dementia.  EXAM: CT HEAD WITHOUT CONTRAST  CT CERVICAL SPINE WITHOUT CONTRAST  TECHNIQUE: Multidetector CT imaging of the head and cervical spine was performed following the standard protocol without intravenous contrast. Multiplanar CT image reconstructions of the cervical spine were also generated.  COMPARISON:  Head CT 01/23/2014.  Cervical spine CT 09/19/2013.  FINDINGS: CT HEAD FINDINGS  There is mild or moderate cerebral atrophy, unchanged. Chronic left occipital infarct is unchanged. Patchy hypodensities in the subcortical and deep cerebral white matter are similar to the prior CT and nonspecific but compatible with moderate chronic small vessel ischemic disease. There is no evidence of acute cortical infarct, intracranial hemorrhage, mass, midline shift, or extra-axial fluid collection.  Prior bilateral cataract extraction is noted. Mastoid air cells and visualized paranasal sinuses are clear. No acute skull fracture is identified  CT CERVICAL SPINE FINDINGS  Straightening of the cervical spine is unchanged with slight anterolisthesis of C4 on C5. Severe disc space narrowing is again seen from C4-5 to C6-7 with associated degenerative endplate osteophytosis. Facet ankylosis is again seen on the right at C4-5. Moderate to severe multilevel facet arthrosis is present, worse on the right. No acute cervical spine fracture is identified.  Medialization of the common carotid arteries is noted. Visualized lung apices are clear. Thyroid gland is mildly heterogeneous diffusely without discrete, sizable nodule identified.  IMPRESSION: 1. No evidence of acute intracranial abnormality. 2. Moderate chronic small vessel ischemic disease and chronic left occipital infarct. 3. No acute osseous abnormality identified in the cervical spine. Advanced spondylosis.   Electronically Signed   By: Logan Bores   On: 02/06/2014 16:01   Dg Hips Bilat With Pelvis 2v  02/06/2014   CLINICAL DATA:  Unwitnessed fall today, LEFT hip pain radiating down LEFT lower extremity, history hypertension, breast cancer,  stroke  EXAM: DG HIP W/ PELVIS 2V BILAT  COMPARISON:  LEFT hip radiographs 10/11/2013, pelvic radiograph 09/19/2013  FINDINGS: Large calcified leiomyoma within pelvis 7.5 x 3.8 cm.  Diffuse osseous demineralization.  Hip joint spaces preserved.  Minimal sclerosis at the SI joints unchanged.  Degenerative disc disease changes lower lumbar spine.  Scattered atherosclerotic calcifications.  No acute fracture, dislocation or bone destruction.  IMPRESSION: No acute abnormalities.   Electronically Signed   By: Lavonia Dana M.D.   On: 02/06/2014 16:28     EKG Interpretation None      MDM   Final diagnoses:  Fall  contusion   She presents to the ER for evaluation after an unwitnessed fall. Patient was complaining of headache, did hit her head on the ground. Unknown if there was loss of consciousness, but she is at her normal baseline now. She does have dementia. CT head and cervical spine were performed because of this, both were negative. Patient had initially complained of hip pain, but she is not expressing any pain currently. X-rays of both hips were obtained and are negative. She complained of right ankle and left foot pain with these images have been obtained and are negative for acute injury. Patient will be discharged to follow-up as  needed.    Orpah Greek, MD 02/06/14 1745

## 2014-02-06 NOTE — ED Notes (Signed)
Patient transported to X-ray 

## 2014-02-06 NOTE — ED Notes (Addendum)
Pt had an unwitnessed fall, states she hit her head on carpet, . Also c/o left side hip pain.

## 2014-02-06 NOTE — Discharge Instructions (Signed)
Contusion °A contusion is a deep bruise. Contusions are the result of an injury that caused bleeding under the skin. The contusion may turn blue, purple, or yellow. Minor injuries will give you a painless contusion, but more severe contusions may stay painful and swollen for a few weeks.  °CAUSES  °A contusion is usually caused by a blow, trauma, or direct force to an area of the body. °SYMPTOMS  °· Swelling and redness of the injured area. °· Bruising of the injured area. °· Tenderness and soreness of the injured area. °· Pain. °DIAGNOSIS  °The diagnosis can be made by taking a history and physical exam. An X-ray, CT scan, or MRI may be needed to determine if there were any associated injuries, such as fractures. °TREATMENT  °Specific treatment will depend on what area of the body was injured. In general, the best treatment for a contusion is resting, icing, elevating, and applying cold compresses to the injured area. Over-the-counter medicines may also be recommended for pain control. Ask your caregiver what the best treatment is for your contusion. °HOME CARE INSTRUCTIONS  °· Put ice on the injured area. °¨ Put ice in a plastic bag. °¨ Place a towel between your skin and the bag. °¨ Leave the ice on for 15-20 minutes, 3-4 times a day, or as directed by your health care provider. °· Only take over-the-counter or prescription medicines for pain, discomfort, or fever as directed by your caregiver. Your caregiver may recommend avoiding anti-inflammatory medicines (aspirin, ibuprofen, and naproxen) for 48 hours because these medicines may increase bruising. °· Rest the injured area. °· If possible, elevate the injured area to reduce swelling. °SEEK IMMEDIATE MEDICAL CARE IF:  °· You have increased bruising or swelling. °· You have pain that is getting worse. °· Your swelling or pain is not relieved with medicines. °MAKE SURE YOU:  °· Understand these instructions. °· Will watch your condition. °· Will get help right  away if you are not doing well or get worse. °Document Released: 10/21/2004 Document Revised: 01/16/2013 Document Reviewed: 11/16/2010 °[employer]® Patient Information ©2015 [employer]. This information is not intended to replace advice given to you by your health care provider. Make sure you discuss any questions you have with your health care provider. ° °

## 2014-10-28 ENCOUNTER — Encounter: Payer: Self-pay | Admitting: Podiatry

## 2014-10-28 ENCOUNTER — Ambulatory Visit (INDEPENDENT_AMBULATORY_CARE_PROVIDER_SITE_OTHER): Payer: Medicare PPO | Admitting: Podiatry

## 2014-10-28 VITALS — BP 150/93 | HR 61

## 2014-10-28 DIAGNOSIS — L03032 Cellulitis of left toe: Secondary | ICD-10-CM

## 2014-10-28 DIAGNOSIS — L03012 Cellulitis of left finger: Secondary | ICD-10-CM

## 2014-10-28 DIAGNOSIS — B351 Tinea unguium: Secondary | ICD-10-CM

## 2014-10-28 DIAGNOSIS — L600 Ingrowing nail: Secondary | ICD-10-CM

## 2014-10-28 DIAGNOSIS — IMO0002 Reserved for concepts with insufficient information to code with codable children: Secondary | ICD-10-CM | POA: Insufficient documentation

## 2014-10-28 NOTE — Progress Notes (Signed)
SUBJECTIVE: 81 y.o. year old female presents accompanied by her care taker with ingrown nail on left great toe.  Stated that she was treated with antibiotics last week prior to come today.   OBJECTIVE: DERMATOLOGIC EXAMINATION: Nails: All nails are thick, yellow and dystrophic. Bleeding ingrown nail left great toe lateral border. Enlarged left great toe from infection.  VASCULAR EXAMINATION OF LOWER LIMBS: Pedal pulses: All pedal pulses are faintly palpable bilateral.  No proximal ascending cellulitis or edema noted. NEUROLOGIC EXAMINATION OF THE LOWER LIMBS: Deferred. MUSCULOSKELETAL EXAMINATION: No gross deformities.   ASSESSMENT: Onychomycosis x 10. Infected ingrown nail left great toe lateral border. Infection localized to ungual labia left hallux.   PLAN: Affected left great toe was anesthetized with total 50ml mixture of 50/50 0.5% Marcaine plain and 1% Xylocaine plain. Affected lateral nail border was reflected with a nail elevator and excised with nail nipper. Proximal nail matrix tissue was cauterized with Phenol soaked cotton applicator x 4 and neutralized with Alcohol soaked cotton applicator. The wound was dressed with Amerigel ointment dressing. Home care instructions and supply dispensed.  All remaining mycotic nails were debrided. Return in 1 week for follow up.

## 2014-10-28 NOTE — Patient Instructions (Signed)
Left great toe ingrown nail surgery was done. Follow soaking instruction.  Some redness and drainage is expected. Call the office if the area gets feverish with increased redness and drainage. Return in one week.

## 2014-11-03 ENCOUNTER — Encounter (HOSPITAL_BASED_OUTPATIENT_CLINIC_OR_DEPARTMENT_OTHER): Payer: Self-pay | Admitting: *Deleted

## 2014-11-03 ENCOUNTER — Emergency Department (HOSPITAL_BASED_OUTPATIENT_CLINIC_OR_DEPARTMENT_OTHER)
Admission: EM | Admit: 2014-11-03 | Discharge: 2014-11-03 | Disposition: A | Discharge status: Home / Self Care | Payer: Medicare PPO | Attending: Emergency Medicine | Admitting: Emergency Medicine

## 2014-11-03 ENCOUNTER — Emergency Department (HOSPITAL_BASED_OUTPATIENT_CLINIC_OR_DEPARTMENT_OTHER): Payer: Medicare PPO

## 2014-11-03 DIAGNOSIS — R55 Syncope and collapse: Secondary | ICD-10-CM | POA: Diagnosis present

## 2014-11-03 DIAGNOSIS — Z853 Personal history of malignant neoplasm of breast: Secondary | ICD-10-CM | POA: Insufficient documentation

## 2014-11-03 DIAGNOSIS — Z8673 Personal history of transient ischemic attack (TIA), and cerebral infarction without residual deficits: Secondary | ICD-10-CM | POA: Insufficient documentation

## 2014-11-03 DIAGNOSIS — F0390 Unspecified dementia without behavioral disturbance: Secondary | ICD-10-CM | POA: Insufficient documentation

## 2014-11-03 DIAGNOSIS — Z880 Allergy status to penicillin: Secondary | ICD-10-CM | POA: Diagnosis not present

## 2014-11-03 DIAGNOSIS — I10 Essential (primary) hypertension: Secondary | ICD-10-CM | POA: Diagnosis not present

## 2014-11-03 DIAGNOSIS — I959 Hypotension, unspecified: Secondary | ICD-10-CM | POA: Insufficient documentation

## 2014-11-03 DIAGNOSIS — Z79899 Other long term (current) drug therapy: Secondary | ICD-10-CM | POA: Diagnosis not present

## 2014-11-03 DIAGNOSIS — Z7951 Long term (current) use of inhaled steroids: Secondary | ICD-10-CM | POA: Diagnosis not present

## 2014-11-03 LAB — CBC WITH DIFFERENTIAL/PLATELET
Basophils Absolute: 0 10*3/uL (ref 0.0–0.1)
Basophils Relative: 1 %
EOS ABS: 0.1 10*3/uL (ref 0.0–0.7)
EOS PCT: 3 %
HCT: 31.4 % — ABNORMAL LOW (ref 36.0–46.0)
Hemoglobin: 9.7 g/dL — ABNORMAL LOW (ref 12.0–15.0)
LYMPHS ABS: 1.2 10*3/uL (ref 0.7–4.0)
Lymphocytes Relative: 21 %
MCH: 28 pg (ref 26.0–34.0)
MCHC: 30.9 g/dL (ref 30.0–36.0)
MCV: 90.5 fL (ref 78.0–100.0)
Monocytes Absolute: 0.6 10*3/uL (ref 0.1–1.0)
Monocytes Relative: 10 %
NEUTROS PCT: 65 %
Neutro Abs: 3.8 10*3/uL (ref 1.7–7.7)
PLATELETS: 291 10*3/uL (ref 150–400)
RBC: 3.47 MIL/uL — ABNORMAL LOW (ref 3.87–5.11)
RDW: 13.7 % (ref 11.5–15.5)
WBC: 5.7 10*3/uL (ref 4.0–10.5)

## 2014-11-03 LAB — COMPREHENSIVE METABOLIC PANEL
ALK PHOS: 54 U/L (ref 38–126)
ALT: 16 U/L (ref 14–54)
AST: 24 U/L (ref 15–41)
Albumin: 3.4 g/dL — ABNORMAL LOW (ref 3.5–5.0)
Anion gap: 6 (ref 5–15)
BILIRUBIN TOTAL: 0.6 mg/dL (ref 0.3–1.2)
BUN: 17 mg/dL (ref 6–20)
CHLORIDE: 100 mmol/L — AB (ref 101–111)
CO2: 33 mmol/L — ABNORMAL HIGH (ref 22–32)
CREATININE: 1.19 mg/dL — AB (ref 0.44–1.00)
Calcium: 9 mg/dL (ref 8.9–10.3)
GFR, EST AFRICAN AMERICAN: 47 mL/min — AB (ref >60)
GFR, EST NON AFRICAN AMERICAN: 40 mL/min — AB (ref >60)
Glucose, Bld: 108 mg/dL — ABNORMAL HIGH (ref 65–99)
Potassium: 3.6 mmol/L (ref 3.5–5.1)
Sodium: 139 mmol/L (ref 135–145)
TOTAL PROTEIN: 6.9 g/dL (ref 6.5–8.1)

## 2014-11-03 LAB — URINALYSIS, ROUTINE W REFLEX MICROSCOPIC
Bilirubin Urine: NEGATIVE
GLUCOSE, UA: NEGATIVE mg/dL
Hgb urine dipstick: NEGATIVE
KETONES UR: NEGATIVE mg/dL
Leukocytes, UA: NEGATIVE
NITRITE: NEGATIVE
PROTEIN: NEGATIVE mg/dL
Specific Gravity, Urine: 1.009 (ref 1.005–1.030)
Urobilinogen, UA: 1 mg/dL (ref 0.0–1.0)
pH: 8 (ref 5.0–8.0)

## 2014-11-03 LAB — RAPID URINE DRUG SCREEN, HOSP PERFORMED
AMPHETAMINES: NOT DETECTED
BENZODIAZEPINES: NOT DETECTED
Barbiturates: NOT DETECTED
COCAINE: NOT DETECTED
OPIATES: NOT DETECTED
TETRAHYDROCANNABINOL: NOT DETECTED

## 2014-11-03 LAB — CBG MONITORING, ED: GLUCOSE-CAPILLARY: 116 mg/dL — AB (ref 65–99)

## 2014-11-03 LAB — TROPONIN I: Troponin I: 0.03 ng/mL (ref <0.031)

## 2014-11-03 NOTE — ED Notes (Signed)
Per EMS was called due to low blood pressure and pt being hard to wake up. VVS per EMS and pt would wake up when stimulated. Pt has dementia and the nursing home reported to EMS that they would call the family.

## 2014-11-03 NOTE — ED Provider Notes (Signed)
CSN: [id number]     Arrival date & time 11/03/14  1404 History   First MD Initiated Contact with Patient 11/03/14 1417     Chief Complaint  Patient presents with  . Hypotension  . Loss of Consciousness   Level V caveat: Dementia  (Consider location/radiation/quality/duration/timing/severity/associated sxs/prior Treatment) HPI Anne Compton is a 81 y.o. female with a history of dementia, comes in for evaluation of hypertension and loss of consciousness. History of present illness is obtained from nursing note and EMS. Per nursing note, patient's care facility was concerned because patient's blood pressure was approximately 90 systolic this morning and patient was difficult to arouse. EMS was called. Upon EMS arrival, they report blood pressure as 116/62 and patient was verbally responsive. Patient denies any medical complaints at this time. Denies any pain or discomfort. CBG on arrival 33  Past Medical History  Diagnosis Date  . Vascular dementia   . Hypertension   . Transient cerebrovascular ischemia   . Cancer ([employer])   . Breast cancer ([place])   . Stroke [facility])     TIA   Past Surgical History  Procedure Laterality Date  . Joint replacement     History reviewed. No pertinent family history. Social History  Substance Use Topics  . Smoking status: Unknown If Ever Smoked  . Smokeless tobacco: None  . Alcohol Use: No     Comment: pt lives in LTCF   OB History    No data available     Review of Systems  Unable to perform ROS     Allergies  Penicillins  Home Medications   Prior to Admission medications   Medication Sig Start Date End Date Taking? Authorizing Provider  acetaminophen (TYLENOL) 325 MG tablet Take 650 mg by mouth 2 (two) times daily. As needed for pain      Historical Provider, MD  atorvastatin (LIPITOR) 10 MG tablet Take 5 mg by mouth at bedtime.     Historical Provider, MD  Cholecalciferol 1000 UNITS tablet Take 1,000 Units by mouth every morning.      Historical Provider, MD  citalopram (CELEXA) 20 MG tablet Take 20 mg by mouth daily.     Historical Provider, MD  clomiPHENE (CLOMID) 50 MG tablet Take 25 mg by mouth at bedtime.    Historical Provider, MD  clopidogrel (PLAVIX) 75 MG tablet Take 75 mg by mouth daily.      Historical Provider, MD  donepezil (ARICEPT) 10 MG tablet Take 10 mg by mouth at bedtime.    Historical Provider, MD  feeding supplement (ENSURE IMMUNE HEALTH) LIQD Take 237 mLs by mouth daily at 3 pm. At 2 pm    Historical Provider, MD  ferrous sulfate 325 (65 FE) MG tablet Take 325 mg by mouth daily with breakfast.      Historical Provider, MD  fluticasone (FLONASE) 50 MCG/ACT nasal spray Place into both nostrils daily.    Historical Provider, MD  HYDRALAZINE HCL PO Take 75 mg by mouth daily.    Historical Provider, MD  labetalol (NORMODYNE) 200 MG tablet Take 200 mg by mouth 2 (two) times daily. At 8 am and 8 pm for blood pressure    Historical Provider, MD  LORazepam (ATIVAN) 0.5 MG tablet Take 0.5 mg by mouth 3 (three) times daily as needed. For agitation or anxiety    Historical Provider, MD  losartan (COZAAR) 100 MG tablet Take 100 mg by mouth daily.      Historical Provider, MD  memantine [facility])  5 MG tablet Take 5 mg by mouth daily. Discontinue on 06/08/11    Historical Provider, MD  Multiple Vitamin (MULTIVITAMIN) tablet Take 1 tablet by mouth daily.    Historical Provider, MD  oseltamivir (TAMIFLU) 75 MG capsule Take 1 capsule (75 mg total) by mouth every 12 (twelve) hours. 02/20/13   Pattricia Boss, MD  PRESCRIPTION MEDICATION Apply 1 mL topically every 6 (six) hours as needed. Lorazepam 0.5mg /55ml gel         For agitation and anxiety     Historical Provider, MD  QUEtiapine (SEROQUEL) 25 MG tablet Take 12.5 mg by mouth at bedtime.    Historical Provider, MD  ranitidine (ZANTAC) 75 MG tablet Take 75 mg by mouth daily.     Historical Provider, MD  rivastigmine (EXELON) 3 MG capsule Take 3 mg by mouth every other day. For  2 weeks starting 05/18/11    Historical Provider, MD  traMADol (ULTRAM) 50 MG tablet Take 50 mg by mouth 2 (two) times daily. For pain    Historical Provider, MD  triamterene-hydrochlorothiazide (MAXZIDE-25) 37.5-25 MG per tablet Take 0.5 tablets by mouth daily.     Historical Provider, MD   BP 139/60 mmHg  Pulse 59  Temp(Src) 98.2 F (36.8 C) (Oral)  Resp 14  SpO2 95% Physical Exam  Constitutional: She is oriented to person, place, and time. She appears well-developed and well-nourished.  African-American female  HENT:  Head: Normocephalic and atraumatic.  Mouth/Throat: Oropharynx is clear and moist.  Eyes: Conjunctivae are normal. Right eye exhibits no discharge. Left eye exhibits no discharge. No scleral icterus.  Pupils are pinpoint and nonreactive.  Neck: Neck supple.  Cardiovascular: Normal rate, regular rhythm and normal heart sounds.   Pulmonary/Chest: Effort normal and breath sounds normal. No respiratory distress. She has no wheezes. She has no rales.  Lungs are clear to auscultation. Wheezing is audible and suspected to be from upper airway.  Abdominal: Soft. There is no tenderness.  Musculoskeletal: She exhibits no tenderness.  Neurological: She is alert and oriented to person, place, and time.  GCS of 14. Patient with eyes closed, but will open upon command. Cranial Nerves II-XII grossly intact. Moves all extremities without ataxia. Muscle strength appears to be baseline. Sensation is intact.  Skin: Skin is warm and dry. No rash noted.  Psychiatric: She has a normal mood and affect.  Nursing note and vitals reviewed.   ED Course  Procedures (including critical care time) Labs Review Labs Reviewed  COMPREHENSIVE METABOLIC PANEL - Abnormal; Notable for the following:    Chloride 100 (*)    CO2 33 (*)    Glucose, Bld 108 (*)    Creatinine, Ser 1.19 (*)    Albumin 3.4 (*)    GFR calc non Af Amer 40 (*)    GFR calc Af Amer 47 (*)    All other components within normal  limits  CBC WITH DIFFERENTIAL/PLATELET - Abnormal; Notable for the following:    RBC 3.47 (*)    Hemoglobin 9.7 (*)    HCT 31.4 (*)    All other components within normal limits  CBG MONITORING, ED - Abnormal; Notable for the following:    Glucose-Capillary 116 (*)    All other components within normal limits  TROPONIN I  URINALYSIS, ROUTINE W REFLEX MICROSCOPIC (NOT AT [facility])  URINE RAPID DRUG SCREEN, HOSP PERFORMED    Imaging Review Dg Chest 2 View  11/03/2014   CLINICAL DATA:  Wheezing.  EXAM: CHEST  2  VIEW  COMPARISON:  February 20, 2013.  FINDINGS: Stable cardiomegaly. Stable elevated right hemidiaphragm. No pneumothorax or pleural effusion is noted. No acute pulmonary disease is noted. Bony thorax is unremarkable.  IMPRESSION: No acute cardiopulmonary abnormality seen.   Electronically Signed   By: Marijo Conception, M.D.   On: 11/03/2014 15:11   I have personally reviewed and evaluated these images and lab results as part of my medical decision-making.   EKG Interpretation   Date/Time:  Sunday November 03 2014 14:14:44 EDT Ventricular Rate:  61 PR Interval:  186 QRS Duration: 100 QT Interval:  468 QTC Calculation: 471 R Axis:   -49 Text Interpretation:  Normal sinus rhythm Left axis deviation Left atrial  enlargement Abnormal ECG No significant change since last tracing  Confirmed by KOHUT  MD, STEPHEN ([id number]) on 11/03/2014 2:57:29 PM     Meds given in ED:  Medications - No data to display  New Prescriptions   No medications on file   Filed Vitals:   11/03/14 1408 11/03/14 1415 11/03/14 1530 11/03/14 1600  BP: 116/62  161/70 139/60  Pulse: 56 58 57 59  Temp: 98.2 F (36.8 C)     TempSrc: Oral     Resp: 16 16 15 14   SpO2: 100% 99% 97% 95%    MDM  Vitals stable -afebrile Pt resting comfortably in ED. PE-physical exam is grossly unremarkable. Patient with bilateral myosis. Labwork-labs are grossly unremarkable. Slight dip in hemoglobin at 9.7 with patient's  baseline at 11. Troponin negative. Urinalysis without evidence of UTI. UDS is unremarkable. Imaging-chest x-ray shows no acute current pulmonary pathology  DDX--patient with history of dementia here for evaluation of low lood pressure and difficult to arouse. Patient's blood pressures have been normal in the ED. CBG on arrival was 116. Labs are otherwise unremarkable. EKG reassuring. Bilateral myosis on exam possibly secondary to Aricept. No evidence of opiate use or overdose. Patient appears to be at her baseline. Baseline neurological function. No focal neurodeficits. Overall, she appears well, nontoxic, hemodynamically stable and appropriate for discharge.  I discussed all relevant lab findings and imaging results with pt and they verbalized understanding. Discussed f/u with PCP within 48 hrs and return precautions, pt very amenable to plan. Prior to patient discharge, I discussed and reviewed this case with Dr. Wilson Singer, who also saw and evaluated the patient and agrees with above plan.    Final diagnoses:  Dementia, without behavioral disturbance       Comer Locket, PA-C 11/03/14 1645  Virgel Manifold, MD 11/07/14 1004

## 2014-11-03 NOTE — Discharge Instructions (Signed)
You were evaluated in the ED today and there does not appear to be an emergent cause for your symptoms at this time. Your blood pressure and glucose were all reassuring. Her labs and exam were all reassuring. Your chest x-ray was normal. Please follow-up with your primary care doctor next week for reevaluation. Return to ED for worsening symptoms.  Dementia Dementia is a general term for problems with brain function. A person with dementia has memory loss and a hard time with at least one other brain function such as thinking, speaking, or problem solving. Dementia can affect social functioning, how you do your job, your mood, or your personality. The changes may be hidden for a long time. The earliest forms of this disease are usually not detected by family or friends. Dementia can be:  Irreversible.  Potentially reversible.  Partially reversible.  Progressive. This means it can get worse over time. CAUSES  Irreversible dementia causes may include:  Degeneration of brain cells (Alzheimer disease or Lewy body dementia).  Multiple small strokes (vascular dementia).  Infection (chronic meningitis or Creutzfeldt-Jakob disease).  Frontotemporal dementia. This affects younger people, age 81 to 70, compared to those who have Alzheimer disease.  Dementia associated with other disorders like Parkinson disease, Huntington disease, or HIV-associated dementia. Potentially or partially reversible dementia causes may include:  Medicines.  Metabolic causes such as excessive alcohol intake, vitamin B12 deficiency, or thyroid disease.  Masses or pressure in the brain such as a tumor, blood clot, or hydrocephalus. SIGNS AND SYMPTOMS  Symptoms are often hard to detect. Family members or coworkers may not notice them early in the disease process. Different people with dementia may have different symptoms. Symptoms can include:  A hard time with memory, especially recent memory. Long-term memory may  not be impaired.  Asking the same question multiple times or forgetting something someone just said.  A hard time speaking your thoughts or finding certain words.  A hard time solving problems or performing familiar tasks (such as how to use a telephone).  Sudden changes in mood.  Changes in personality, especially increasing moodiness or mistrust.  Depression.  A hard time understanding complex ideas that were never a problem in the past. DIAGNOSIS  There are no specific tests for dementia.   Your health care provider may recommend a thorough evaluation. This is because some forms of dementia can be reversible. The evaluation will likely include a physical exam and getting a detailed history from you and a family member. The history often gives the best clues and suggestions for a diagnosis.  Memory testing may be done. A detailed brain function evaluation called neuropsychologic testing may be helpful.  Lab tests and brain imaging (such as a CT scan or MRI scan) are sometimes important.  Sometimes observation and re-evaluation over time is very helpful. TREATMENT  Treatment depends on the cause.   If the problem is a vitamin deficiency, it may be helped or cured with supplements.  For dementias such as Alzheimer disease, medicines are available to stabilize or slow the course of the disease. There are no cures for this type of dementia.  Your health care provider can help direct you to groups, organizations, and other health care providers to help with decisions in the care of you or your loved one. HOME CARE INSTRUCTIONS The care of individuals with dementia is varied and dependent upon the progression of the dementia. The following suggestions are intended for the person living with, or caring for, the  person with dementia.  Create a safe environment.  Remove the locks on bathroom doors to prevent the person from accidentally locking himself or herself in.  Use childproof  latches on kitchen cabinets and any place where cleaning supplies, chemicals, or alcohol are kept.  Use childproof covers in unused electrical outlets.  Install childproof devices to keep doors and windows secured.  Remove stove knobs or install safety knobs and an automatic shut-off on the stove.  Lower the temperature on water heaters.  Label medicines and keep them locked up.  Secure knives, lighters, matches, power tools, and guns, and keep these items out of reach.  Keep the house free from clutter. Remove rugs or anything that might contribute to a fall.  Remove objects that might break and hurt the person.  Make sure lighting is good, both inside and outside.  Install grab rails as needed.  Use a monitoring device to alert you to falls or other needs for help.  Reduce confusion.  Keep familiar objects and people around.  Use night lights or dim lights at night.  Label items or areas.  Use reminders, notes, or directions for daily activities or tasks.  Keep a simple, consistent routine for waking, meals, bathing, dressing, and bedtime.  Create a calm, quiet environment.  Place large clocks and calendars prominently.  Display emergency numbers and home address near all telephones.  Use cues to establish different times of the day. An example is to open curtains to let the natural light in during the day.   Use effective communication.  Choose simple words and short sentences.  Use a gentle, calm tone of voice.  Be careful not to interrupt.  If the person is struggling to find a word or communicate a thought, try to provide the word or thought.  Ask one question at a time. Allow the person ample time to answer questions. Repeat the question again if the person does not respond.  Reduce nighttime restlessness.  Provide a comfortable bed.  Have a consistent nighttime routine.  Ensure a regular walking or physical activity schedule. Involve the person  in daily activities as much as possible.  Limit napping during the day.  Limit caffeine.  Attend social events that stimulate rather than overwhelm the senses.  Encourage good nutrition and hydration.  Reduce distractions during meal times and snacks.  Avoid foods that are too hot or too cold.  Monitor chewing and swallowing ability.  Continue with routine vision, hearing, dental, and medical screenings.  Give medicines only as directed by the health care provider.  Monitor driving abilities. Do not allow the person to drive when safe driving is no longer possible.  Register with an identification program which could provide location assistance in the event of a missing person situation. SEEK MEDICAL CARE IF:   New behavioral problems start such as moodiness, aggressiveness, or seeing things that are not there (hallucinations).  Any new problem with brain function happens. This includes problems with balance, speech, or falling a lot.  Problems with swallowing develop.  Any symptoms of other illness happen. Small changes or worsening in any aspect of brain function can be a sign that the illness is getting worse. It can also be a sign of another medical illness such as infection. Seeing a health care provider right away is important. SEEK IMMEDIATE MEDICAL CARE IF:   A fever develops.  New or worsened confusion develops.  New or worsened sleepiness develops.  Staying awake becomes hard to  do.   This information is not intended to replace advice given to you by your health care provider. Make sure you discuss any questions you have with your health care provider.   Document Released: 07/07/2000 Document Revised: 02/01/2014 Document Reviewed: 06/08/2010 Elsevier Interactive Patient Education [employer].

## 2014-11-04 ENCOUNTER — Ambulatory Visit (INDEPENDENT_AMBULATORY_CARE_PROVIDER_SITE_OTHER): Payer: Medicare PPO | Admitting: Podiatry

## 2014-11-04 ENCOUNTER — Encounter: Payer: Self-pay | Admitting: Podiatry

## 2014-11-04 DIAGNOSIS — Z9889 Other specified postprocedural states: Secondary | ICD-10-CM

## 2014-11-04 NOTE — Patient Instructions (Signed)
Doing well follow nail surgery. D/C soaking and return as needed.

## 2014-11-04 NOTE — Progress Notes (Signed)
Follow up on right great toe nail surgery. Dry wound without drainage. No edema or erythema noted. Well healed post op nail wound. Ok to d/c soaking.' Return as needed.

## 2015-02-14 ENCOUNTER — Inpatient Hospital Stay (HOSPITAL_BASED_OUTPATIENT_CLINIC_OR_DEPARTMENT_OTHER)
Admission: EM | Admit: 2015-02-14 | Discharge: 2015-02-20 | DRG: 391 | Disposition: A | Discharge status: Skilled Nursing Facility | Payer: Medicare Other | Attending: Family Medicine | Admitting: Family Medicine

## 2015-02-14 ENCOUNTER — Emergency Department (HOSPITAL_BASED_OUTPATIENT_CLINIC_OR_DEPARTMENT_OTHER): Payer: Medicare Other

## 2015-02-14 ENCOUNTER — Encounter (HOSPITAL_BASED_OUTPATIENT_CLINIC_OR_DEPARTMENT_OTHER): Payer: Self-pay

## 2015-02-14 DIAGNOSIS — R109 Unspecified abdominal pain: Secondary | ICD-10-CM | POA: Diagnosis not present

## 2015-02-14 DIAGNOSIS — K5730 Diverticulosis of large intestine without perforation or abscess without bleeding: Secondary | ICD-10-CM | POA: Diagnosis present

## 2015-02-14 DIAGNOSIS — Z8673 Personal history of transient ischemic attack (TIA), and cerebral infarction without residual deficits: Secondary | ICD-10-CM

## 2015-02-14 DIAGNOSIS — Z880 Allergy status to penicillin: Secondary | ICD-10-CM

## 2015-02-14 DIAGNOSIS — K828 Other specified diseases of gallbladder: Secondary | ICD-10-CM | POA: Diagnosis present

## 2015-02-14 DIAGNOSIS — Z7951 Long term (current) use of inhaled steroids: Secondary | ICD-10-CM

## 2015-02-14 DIAGNOSIS — K449 Diaphragmatic hernia without obstruction or gangrene: Secondary | ICD-10-CM | POA: Diagnosis present

## 2015-02-14 DIAGNOSIS — Z79810 Long term (current) use of selective estrogen receptor modulators (SERMs): Secondary | ICD-10-CM

## 2015-02-14 DIAGNOSIS — Z7902 Long term (current) use of antithrombotics/antiplatelets: Secondary | ICD-10-CM

## 2015-02-14 DIAGNOSIS — D649 Anemia, unspecified: Secondary | ICD-10-CM | POA: Diagnosis present

## 2015-02-14 DIAGNOSIS — I444 Left anterior fascicular block: Secondary | ICD-10-CM | POA: Diagnosis present

## 2015-02-14 DIAGNOSIS — Z993 Dependence on wheelchair: Secondary | ICD-10-CM

## 2015-02-14 DIAGNOSIS — R0600 Dyspnea, unspecified: Secondary | ICD-10-CM

## 2015-02-14 DIAGNOSIS — N189 Chronic kidney disease, unspecified: Secondary | ICD-10-CM | POA: Diagnosis present

## 2015-02-14 DIAGNOSIS — E876 Hypokalemia: Secondary | ICD-10-CM

## 2015-02-14 DIAGNOSIS — F0151 Vascular dementia with behavioral disturbance: Secondary | ICD-10-CM | POA: Diagnosis present

## 2015-02-14 DIAGNOSIS — I129 Hypertensive chronic kidney disease with stage 1 through stage 4 chronic kidney disease, or unspecified chronic kidney disease: Secondary | ICD-10-CM | POA: Diagnosis present

## 2015-02-14 DIAGNOSIS — E860 Dehydration: Secondary | ICD-10-CM | POA: Diagnosis present

## 2015-02-14 DIAGNOSIS — D72829 Elevated white blood cell count, unspecified: Secondary | ICD-10-CM | POA: Diagnosis present

## 2015-02-14 DIAGNOSIS — Z23 Encounter for immunization: Secondary | ICD-10-CM

## 2015-02-14 DIAGNOSIS — F0150 Vascular dementia without behavioral disturbance: Secondary | ICD-10-CM | POA: Diagnosis present

## 2015-02-14 DIAGNOSIS — G9340 Encephalopathy, unspecified: Secondary | ICD-10-CM

## 2015-02-14 DIAGNOSIS — R4182 Altered mental status, unspecified: Secondary | ICD-10-CM

## 2015-02-14 DIAGNOSIS — E8770 Fluid overload, unspecified: Secondary | ICD-10-CM | POA: Diagnosis present

## 2015-02-14 DIAGNOSIS — E785 Hyperlipidemia, unspecified: Secondary | ICD-10-CM | POA: Diagnosis present

## 2015-02-14 DIAGNOSIS — N390 Urinary tract infection, site not specified: Secondary | ICD-10-CM | POA: Diagnosis present

## 2015-02-14 LAB — CBC WITH DIFFERENTIAL/PLATELET
BASOS ABS: 0 10*3/uL (ref 0.0–0.1)
BASOS PCT: 0 %
Eosinophils Absolute: 0 10*3/uL (ref 0.0–0.7)
Eosinophils Relative: 0 %
HEMATOCRIT: 30.5 % — AB (ref 36.0–46.0)
Hemoglobin: 9.4 g/dL — ABNORMAL LOW (ref 12.0–15.0)
LYMPHS PCT: 5 %
Lymphs Abs: 0.8 10*3/uL (ref 0.7–4.0)
MCH: 26.4 pg (ref 26.0–34.0)
MCHC: 30.8 g/dL (ref 30.0–36.0)
MCV: 85.7 fL (ref 78.0–100.0)
Monocytes Absolute: 0.8 10*3/uL (ref 0.1–1.0)
Monocytes Relative: 5 %
NEUTROS ABS: 13.8 10*3/uL — AB (ref 1.7–7.7)
NEUTROS PCT: 90 %
Platelets: 307 10*3/uL (ref 150–400)
RBC: 3.56 MIL/uL — AB (ref 3.87–5.11)
RDW: 14.8 % (ref 11.5–15.5)
WBC: 15.4 10*3/uL — AB (ref 4.0–10.5)

## 2015-02-14 LAB — COMPREHENSIVE METABOLIC PANEL
ALBUMIN: 3.5 g/dL (ref 3.5–5.0)
ALT: 14 U/L (ref 14–54)
AST: 26 U/L (ref 15–41)
Alkaline Phosphatase: 56 U/L (ref 38–126)
Anion gap: 9 (ref 5–15)
BILIRUBIN TOTAL: 0.7 mg/dL (ref 0.3–1.2)
BUN: 24 mg/dL — AB (ref 6–20)
CHLORIDE: 100 mmol/L — AB (ref 101–111)
CO2: 27 mmol/L (ref 22–32)
CREATININE: 1.31 mg/dL — AB (ref 0.44–1.00)
Calcium: 9 mg/dL (ref 8.9–10.3)
GFR calc Af Amer: 41 mL/min — ABNORMAL LOW (ref >60)
GFR, EST NON AFRICAN AMERICAN: 36 mL/min — AB (ref >60)
GLUCOSE: 163 mg/dL — AB (ref 65–99)
POTASSIUM: 3.6 mmol/L (ref 3.5–5.1)
Sodium: 136 mmol/L (ref 135–145)
Total Protein: 7.2 g/dL (ref 6.5–8.1)

## 2015-02-14 LAB — URINE MICROSCOPIC-ADD ON: RBC / HPF: NONE SEEN RBC/hpf (ref 0–5)

## 2015-02-14 LAB — URINALYSIS, ROUTINE W REFLEX MICROSCOPIC
Glucose, UA: NEGATIVE mg/dL
Hgb urine dipstick: NEGATIVE
KETONES UR: 15 mg/dL — AB
NITRITE: NEGATIVE
PROTEIN: 30 mg/dL — AB
Specific Gravity, Urine: 1.024 (ref 1.005–1.030)
pH: 5 (ref 5.0–8.0)

## 2015-02-14 LAB — LIPASE, BLOOD: LIPASE: 17 U/L (ref 11–51)

## 2015-02-14 LAB — TROPONIN I: Troponin I: 0.03 ng/mL (ref <0.031)

## 2015-02-14 LAB — I-STAT CG4 LACTIC ACID, ED: Lactic Acid, Venous: 0.7 mmol/L (ref 0.5–2.0)

## 2015-02-14 MED ORDER — IOHEXOL 300 MG/ML  SOLN
80.0000 mL | Freq: Once | INTRAMUSCULAR | Status: AC | PRN
  Administered 2015-02-14: 80 mL via INTRAVENOUS

## 2015-02-14 NOTE — ED Provider Notes (Signed)
CSN: [id number]     Arrival date & time 02/14/15  1940 History  By signing my name below, I, Rowan Blase, attest that this documentation has been prepared under the direction and in the presence of Noemi Chapel, MD . Electronically Signed: Rowan Blase, Scribe. 02/14/2015. 8:40 PM.   Chief Complaint  Patient presents with  . Abdominal Pain   LEVEL 5 CAVEAT DUE TO dementia  The history is provided by the nursing home. No language interpreter was used.    HPI Comments:  DAYANA HERGERT is a 82 y.o. female who presents to the Emergency Department via EMS from nursing home with a complaint of abdominal pain per nursing home. Pt is non-verbal. Unable to fully obtain HPI/ROS due to non-verbal dementia.  Past Medical History  Diagnosis Date  . Vascular dementia   . Hypertension   . Transient cerebrovascular ischemia   . Cancer ([place])   . Breast cancer ([place])   . Stroke [facility])     TIA   Past Surgical History  Procedure Laterality Date  . Joint replacement     No family history on file. Social History  Substance Use Topics  . Smoking status: Unknown If Ever Smoked  . Smokeless tobacco: None  . Alcohol Use: No     Comment: pt lives in LTCF   OB History    No data available     Review of Systems  Unable to perform ROS: Patient nonverbal   Allergies  Penicillins  Home Medications   Prior to Admission medications   Medication Sig Start Date End Date Taking? Authorizing Provider  acetaminophen (TYLENOL) 325 MG tablet Take 650 mg by mouth 2 (two) times daily. As needed for pain      Historical Provider, MD  atorvastatin (LIPITOR) 10 MG tablet Take 5 mg by mouth at bedtime.     Historical Provider, MD  Cholecalciferol 1000 UNITS tablet Take 1,000 Units by mouth every morning.     Historical Provider, MD  citalopram (CELEXA) 20 MG tablet Take 20 mg by mouth daily.     Historical Provider, MD  clomiPHENE (CLOMID) 50 MG tablet Take 25 mg by mouth at bedtime.     Historical Provider, MD  clopidogrel (PLAVIX) 75 MG tablet Take 75 mg by mouth daily.      Historical Provider, MD  donepezil (ARICEPT) 10 MG tablet Take 10 mg by mouth at bedtime.    Historical Provider, MD  feeding supplement (ENSURE IMMUNE HEALTH) LIQD Take 237 mLs by mouth daily at 3 pm. At 2 pm    Historical Provider, MD  ferrous sulfate 325 (65 FE) MG tablet Take 325 mg by mouth daily with breakfast.      Historical Provider, MD  fluticasone (FLONASE) 50 MCG/ACT nasal spray Place into both nostrils daily.    Historical Provider, MD  HYDRALAZINE HCL PO Take 75 mg by mouth daily.    Historical Provider, MD  labetalol (NORMODYNE) 200 MG tablet Take 200 mg by mouth 2 (two) times daily. At 8 am and 8 pm for blood pressure    Historical Provider, MD  LORazepam (ATIVAN) 0.5 MG tablet Take 0.5 mg by mouth 3 (three) times daily as needed. For agitation or anxiety    Historical Provider, MD  losartan (COZAAR) 100 MG tablet Take 100 mg by mouth daily.      Historical Provider, MD  memantine (NAMENDA) 5 MG tablet Take 5 mg by mouth daily. Discontinue on 06/08/11    Historical Provider,  MD  Multiple Vitamin (MULTIVITAMIN) tablet Take 1 tablet by mouth daily.    Historical Provider, MD  oseltamivir (TAMIFLU) 75 MG capsule Take 1 capsule (75 mg total) by mouth every 12 (twelve) hours. 02/20/13   Pattricia Boss, MD  PRESCRIPTION MEDICATION Apply 1 mL topically every 6 (six) hours as needed. Lorazepam 0.5mg /50ml gel         For agitation and anxiety     Historical Provider, MD  QUEtiapine (SEROQUEL) 25 MG tablet Take 12.5 mg by mouth at bedtime.    Historical Provider, MD  ranitidine (ZANTAC) 75 MG tablet Take 75 mg by mouth daily.     Historical Provider, MD  rivastigmine (EXELON) 3 MG capsule Take 3 mg by mouth every other day. For 2 weeks starting 05/18/11    Historical Provider, MD  traMADol (ULTRAM) 50 MG tablet Take 50 mg by mouth 2 (two) times daily. For pain    Historical Provider, MD   triamterene-hydrochlorothiazide (MAXZIDE-25) 37.5-25 MG per tablet Take 0.5 tablets by mouth daily.     Historical Provider, MD   BP 164/87 mmHg  Pulse 86  Temp(Src) 98.4 F (36.9 C) (Oral)  Resp 22  SpO2 94% Physical Exam  Constitutional: She appears well-developed and well-nourished. No distress.  HENT:  Head: Normocephalic and atraumatic.  Mouth/Throat: Oropharynx is clear and moist. No oropharyngeal exudate.  Eyes: Conjunctivae and EOM are normal. Pupils are equal, round, and reactive to light. Right eye exhibits no discharge. Left eye exhibits no discharge. No scleral icterus.  Neck: Normal range of motion. Neck supple. No JVD present. No thyromegaly present.  Cardiovascular: Normal rate, regular rhythm, normal heart sounds and intact distal pulses.  Exam reveals no gallop and no friction rub.   No murmur heard. Pulmonary/Chest: Effort normal and breath sounds normal. No respiratory distress. She has no wheezes. She has no rales.  Abdominal: Soft. Bowel sounds are normal. She exhibits no distension and no mass. There is tenderness. There is guarding (guarding on right; no guarding on L).  Full  R abd. Guarding and grimace in upper through lower R abd.  Musculoskeletal: Normal range of motion. She exhibits no edema or tenderness.  Lymphadenopathy:    She has no cervical adenopathy.  Neurological: She is alert. Coordination normal.  Skin: Skin is warm and dry. No rash noted. No erythema.  Psychiatric:  Non-verbal  Nursing note and vitals reviewed.  ED Course  Procedures  DIAGNOSTIC STUDIES:  Oxygen Saturation is 96% on RA, adequate by my interpretation.    Labs Review Labs Reviewed  URINALYSIS, ROUTINE W REFLEX MICROSCOPIC (NOT AT [facility]) - Abnormal; Notable for the following:    Color, Urine RED (*)    APPearance CLOUDY (*)    Bilirubin Urine SMALL (*)    Ketones, ur 15 (*)    Protein, ur 30 (*)    Leukocytes, UA SMALL (*)    All other components within normal limits   CBC WITH DIFFERENTIAL/PLATELET - Abnormal; Notable for the following:    WBC 15.4 (*)    RBC 3.56 (*)    Hemoglobin 9.4 (*)    HCT 30.5 (*)    Neutro Abs 13.8 (*)    All other components within normal limits  COMPREHENSIVE METABOLIC PANEL - Abnormal; Notable for the following:    Chloride 100 (*)    Glucose, Bld 163 (*)    BUN 24 (*)    Creatinine, Ser 1.31 (*)    GFR calc non Af Amer 36 (*)  GFR calc Af Amer 41 (*)    All other components within normal limits  URINE MICROSCOPIC-ADD ON - Abnormal; Notable for the following:    Squamous Epithelial / LPF 0-5 (*)    Bacteria, UA FEW (*)    All other components within normal limits  URINE CULTURE  LIPASE, BLOOD  TROPONIN I  I-STAT CG4 LACTIC ACID, ED    Imaging Review Ct Abdomen Pelvis W Contrast  02/14/2015  CLINICAL DATA:  82 year old female with abdominal pain. EXAM: CT ABDOMEN AND PELVIS WITH CONTRAST TECHNIQUE: Multidetector CT imaging of the abdomen and pelvis was performed using the standard protocol following bolus administration of intravenous contrast. CONTRAST:  66mL OMNIPAQUE IOHEXOL 300 MG/ML  SOLN COMPARISON:  CT dated 02/08/2013 FINDINGS: Evaluation of the study is limited due to streak artifact caused by patient's arms. There is minimal bibasilar dependent atelectatic changes. Small left posterior diaphragmatic defect with herniation of small amount of abdominal fat compatible with a Bochdalek hernia. No intra-abdominal free air or free fluid. The gallbladder is distended. There is stranding in the fat surrounding the gallbladder and within the forehead status. No definite calcified stone identified within the gallbladder. There is an ill-defined 7 mm high attenuating focus (series 3, image 25 and coronal series 6, image 45) which may represent volume averaging artifact with adjacent vessel or stone within the neck of the gallbladder or in the cystic duct. Ultrasound is recommended for better evaluation of the gallbladder.  There is inflammatory changes of the and uncinate process of the pancreas as well as inflammatory changes of the duodenal C-loop may represent pancreatitis versus duodenitis. Correlation with pancreatic enzymes recommended. No drainable fluid collection/abscess identified. The spleen, adrenal glands appear unremarkable. There is mild bilateral renal atrophy. There is no hydronephrosis on either side. The visualized ureters appear unremarkable. The urinary bladder is collapsed. There is diffuse thickening of the bladder wall. Cystitis or an infiltrative process is not excluded. Correlation with urinalysis and urine cytology and further evaluation with cystoscopy if clinically indicated recommended. There is calcification of the uterus compatible with fibroids. There is extensive sigmoid diverticulosis without active inflammatory changes. There is no evidence of bowel obstruction. A moderate size hiatal hernia is seen. There is inflammatory changes of the distal stomach and duodenal C-loop as described may represent gastroenteritis or be related to pancreatitis. A 2.0 x 1.7 cm nodular soft tissue density in the region of the duodenal bulb may represent oral content within the lumen. The soft tissue lesion is not excluded. Upper GI study or endoscopy is recommended for better evaluation. There is aortoiliac atherosclerotic disease. The abdominal aorta and IVC appear patent. No portal venous gas identified. There is no adenopathy. There is a small fat containing umbilical hernia. The abdominal wall soft tissues are grossly unremarkable. There is degenerative changes of the spine. No acute fracture. IMPRESSION: Inflammatory changes of the porta hepaticus, surrounding the head and uncinate process of the pancreas and duodenal C-loop. Findings may represent pancreatitis or gastroduodenitis. Clinical correlation is recommended. Ill-defined high attenuating area along the course of the cystic duct may be volume averaging  artifact with the adjacent vessels or represent stone within the cystic duct. Ultrasound is recommended for better evaluation of the gallbladder. Soft tissue appearing structure within the region of the duodenal bulb may represent oral content, or luminal fold. The soft tissue lesion is not excluded. Endoscopy or upper GI study may provide better evaluation. Diffuse thickening of the bladder wall may represent cystitis or an infiltrative  process. Correlation with urinalysis and further evaluation with endoscopy if clinically indicated recommended. Sigmoid diverticulosis.  No evidence of bowel obstruction. Electronically Signed   By: Anner Crete M.D.   On: 02/14/2015 22:01   Dg Chest Port 1 View  02/14/2015  CLINICAL DATA:  Right lower quadrant pain, dementia EXAM: PORTABLE CHEST 1 VIEW COMPARISON:  11/03/2014 FINDINGS: Cardiomediastinal silhouette is stable. No acute infiltrate or pleural effusion. No pulmonary edema. Elevation of the right hemidiaphragm again noted. IMPRESSION: No active disease. Elevation of the right hemidiaphragm again noted. Electronically Signed   By: Lahoma Crocker M.D.   On: 02/14/2015 23:21   I have personally reviewed and evaluated these images and lab results as part of my medical decision-making.   EKG Interpretation   Date/Time:  Friday February 14 2015 22:50:58 EST Ventricular Rate:  89 PR Interval:  193 QRS Duration: 104 QT Interval:  396 QTC Calculation: 482 R Axis:   -55 Text Interpretation:  Sinus rhythm LAD, consider left anterior fascicular  block Abnormal R-wave progression, late transition Since last tracing rate  faster Confirmed by Elex Mainwaring  MD, Sumire Halbleib ([id number]) on 02/14/2015 11:38:05 PM     MDM   Final diagnoses:  Altered mental status, unspecified altered mental status type  Abdominal pain, unspecified abdominal location    The patient has ongoing abdominal pain, she had imaging showing nonspecific inflammatory changes around the pancreas as well as  the gallbladder being distended, there was no other signs of acute surgical pathology, she had abnormal lab work with a leukocytosis. Urinalysis was underwhelming as was a chest x-ray. EKG was also underwhelming area troponin is normal, lactate is normal, discussed with the hospitalist, Dr. [place] Pink who will admit. The son is comfortable the bedside and states that this is not his mother's normal mental status, she is still not talking and following only occasional commands.  Meds given in ED:  Medications  iohexol (OMNIPAQUE) 300 MG/ML solution 80 mL (80 mLs Intravenous Contrast Given 02/14/15 2131)      I personally performed the services described in this documentation, which was scribed in my presence. The recorded information has been reviewed and is accurate.       Noemi Chapel, MD 02/14/15 [phone]

## 2015-02-14 NOTE — ED Notes (Signed)
Patient transported to CT 

## 2015-02-14 NOTE — ED Notes (Signed)
Per EMS pt non verbal dementia, grimacing of pain to RLQ; pt is from [employer] care

## 2015-02-14 NOTE — ED Notes (Signed)
Sabra Heck MD at bedside to discuss disposition

## 2015-02-15 ENCOUNTER — Inpatient Hospital Stay (HOSPITAL_COMMUNITY): Payer: Medicare Other

## 2015-02-15 DIAGNOSIS — G9340 Encephalopathy, unspecified: Secondary | ICD-10-CM | POA: Diagnosis not present

## 2015-02-15 DIAGNOSIS — Z79810 Long term (current) use of selective estrogen receptor modulators (SERMs): Secondary | ICD-10-CM | POA: Diagnosis not present

## 2015-02-15 DIAGNOSIS — R1013 Epigastric pain: Secondary | ICD-10-CM | POA: Diagnosis not present

## 2015-02-15 DIAGNOSIS — Z993 Dependence on wheelchair: Secondary | ICD-10-CM | POA: Diagnosis not present

## 2015-02-15 DIAGNOSIS — K449 Diaphragmatic hernia without obstruction or gangrene: Secondary | ICD-10-CM | POA: Diagnosis not present

## 2015-02-15 DIAGNOSIS — R10816 Epigastric abdominal tenderness: Secondary | ICD-10-CM

## 2015-02-15 DIAGNOSIS — Z7902 Long term (current) use of antithrombotics/antiplatelets: Secondary | ICD-10-CM | POA: Diagnosis not present

## 2015-02-15 DIAGNOSIS — I129 Hypertensive chronic kidney disease with stage 1 through stage 4 chronic kidney disease, or unspecified chronic kidney disease: Secondary | ICD-10-CM | POA: Diagnosis not present

## 2015-02-15 DIAGNOSIS — R109 Unspecified abdominal pain: Secondary | ICD-10-CM | POA: Diagnosis present

## 2015-02-15 DIAGNOSIS — F0151 Vascular dementia with behavioral disturbance: Secondary | ICD-10-CM

## 2015-02-15 DIAGNOSIS — R933 Abnormal findings on diagnostic imaging of other parts of digestive tract: Secondary | ICD-10-CM | POA: Diagnosis not present

## 2015-02-15 DIAGNOSIS — N390 Urinary tract infection, site not specified: Secondary | ICD-10-CM | POA: Diagnosis not present

## 2015-02-15 DIAGNOSIS — E876 Hypokalemia: Secondary | ICD-10-CM

## 2015-02-15 DIAGNOSIS — D649 Anemia, unspecified: Secondary | ICD-10-CM | POA: Diagnosis not present

## 2015-02-15 DIAGNOSIS — E8770 Fluid overload, unspecified: Secondary | ICD-10-CM | POA: Diagnosis not present

## 2015-02-15 DIAGNOSIS — D72829 Elevated white blood cell count, unspecified: Secondary | ICD-10-CM

## 2015-02-15 DIAGNOSIS — I444 Left anterior fascicular block: Secondary | ICD-10-CM | POA: Diagnosis not present

## 2015-02-15 DIAGNOSIS — Z880 Allergy status to penicillin: Secondary | ICD-10-CM | POA: Diagnosis not present

## 2015-02-15 DIAGNOSIS — R1010 Upper abdominal pain, unspecified: Secondary | ICD-10-CM | POA: Diagnosis not present

## 2015-02-15 DIAGNOSIS — K828 Other specified diseases of gallbladder: Secondary | ICD-10-CM | POA: Diagnosis not present

## 2015-02-15 DIAGNOSIS — E785 Hyperlipidemia, unspecified: Secondary | ICD-10-CM | POA: Diagnosis not present

## 2015-02-15 DIAGNOSIS — K5730 Diverticulosis of large intestine without perforation or abscess without bleeding: Secondary | ICD-10-CM | POA: Diagnosis not present

## 2015-02-15 DIAGNOSIS — Z8673 Personal history of transient ischemic attack (TIA), and cerebral infarction without residual deficits: Secondary | ICD-10-CM | POA: Diagnosis not present

## 2015-02-15 DIAGNOSIS — Z7951 Long term (current) use of inhaled steroids: Secondary | ICD-10-CM | POA: Diagnosis not present

## 2015-02-15 DIAGNOSIS — R0600 Dyspnea, unspecified: Secondary | ICD-10-CM | POA: Diagnosis not present

## 2015-02-15 DIAGNOSIS — Z23 Encounter for immunization: Secondary | ICD-10-CM | POA: Diagnosis not present

## 2015-02-15 DIAGNOSIS — E860 Dehydration: Secondary | ICD-10-CM | POA: Diagnosis not present

## 2015-02-15 DIAGNOSIS — N189 Chronic kidney disease, unspecified: Secondary | ICD-10-CM | POA: Diagnosis not present

## 2015-02-15 LAB — CBC
HEMATOCRIT: 25.8 % — AB (ref 36.0–46.0)
HEMOGLOBIN: 8.3 g/dL — AB (ref 12.0–15.0)
MCH: 26.9 pg (ref 26.0–34.0)
MCHC: 32.2 g/dL (ref 30.0–36.0)
MCV: 83.5 fL (ref 78.0–100.0)
Platelets: 271 10*3/uL (ref 150–400)
RBC: 3.09 MIL/uL — AB (ref 3.87–5.11)
RDW: 15.1 % (ref 11.5–15.5)
WBC: 15.7 10*3/uL — AB (ref 4.0–10.5)

## 2015-02-15 LAB — BASIC METABOLIC PANEL
ANION GAP: 9 (ref 5–15)
BUN: 20 mg/dL (ref 6–20)
CO2: 29 mmol/L (ref 22–32)
Calcium: 9 mg/dL (ref 8.9–10.3)
Chloride: 101 mmol/L (ref 101–111)
Creatinine, Ser: 1.28 mg/dL — ABNORMAL HIGH (ref 0.44–1.00)
GFR calc non Af Amer: 37 mL/min — ABNORMAL LOW (ref >60)
GFR, EST AFRICAN AMERICAN: 43 mL/min — AB (ref >60)
GLUCOSE: 154 mg/dL — AB (ref 65–99)
POTASSIUM: 3.2 mmol/L — AB (ref 3.5–5.1)
Sodium: 139 mmol/L (ref 135–145)

## 2015-02-15 LAB — MRSA PCR SCREENING: MRSA by PCR: POSITIVE — AB

## 2015-02-15 MED ORDER — ACETAMINOPHEN 325 MG PO TABS
650.0000 mg | ORAL_TABLET | Freq: Four times a day (QID) | ORAL | Status: DC | PRN
  Administered 2015-02-19: 650 mg via ORAL

## 2015-02-15 MED ORDER — HEPARIN SODIUM (PORCINE) 5000 UNIT/ML IJ SOLN
5000.0000 [IU] | Freq: Three times a day (TID) | INTRAMUSCULAR | Status: DC
  Administered 2015-02-15 – 2015-02-20 (×13): 5000 [IU] via SUBCUTANEOUS
  Filled 2015-02-15 (×10): qty 1

## 2015-02-15 MED ORDER — ATORVASTATIN CALCIUM 10 MG PO TABS: 5.0000 mg | ORAL_TABLET | Freq: Every day | ORAL | Status: DC

## 2015-02-15 MED ORDER — FERROUS SULFATE 325 (65 FE) MG PO TABS
325.0000 mg | ORAL_TABLET | Freq: Every day | ORAL | Status: DC
  Administered 2015-02-15 – 2015-02-20 (×5): 325 mg via ORAL
  Filled 2015-02-15 (×6): qty 1

## 2015-02-15 MED ORDER — CLOPIDOGREL BISULFATE 75 MG PO TABS
75.0000 mg | ORAL_TABLET | Freq: Every day | ORAL | Status: DC
  Administered 2015-02-15 – 2015-02-16 (×2): 75 mg via ORAL
  Filled 2015-02-15 (×2): qty 1

## 2015-02-15 MED ORDER — FAMOTIDINE 10 MG PO TABS
10.0000 mg | ORAL_TABLET | Freq: Every day | ORAL | Status: DC
  Administered 2015-02-15 – 2015-02-20 (×5): 10 mg via ORAL
  Filled 2015-02-15 (×7): qty 1

## 2015-02-15 MED ORDER — MUPIROCIN 2 % EX OINT
1.0000 "application " | TOPICAL_OINTMENT | Freq: Two times a day (BID) | CUTANEOUS | Status: AC
  Administered 2015-02-15 – 2015-02-19 (×10): 1 via NASAL
  Filled 2015-02-15 (×3): qty 22

## 2015-02-15 MED ORDER — ADULT MULTIVITAMIN W/MINERALS CH
1.0000 | ORAL_TABLET | Freq: Every day | ORAL | Status: DC
  Administered 2015-02-15 – 2015-02-20 (×5): 1 via ORAL
  Filled 2015-02-15 (×5): qty 1

## 2015-02-15 MED ORDER — SODIUM CHLORIDE 0.9 % IJ SOLN
3.0000 mL | Freq: Two times a day (BID) | INTRAMUSCULAR | Status: DC
  Administered 2015-02-15 – 2015-02-19 (×7): 3 mL via INTRAVENOUS

## 2015-02-15 MED ORDER — VITAMIN D 1000 UNITS PO TABS
1000.0000 [IU] | ORAL_TABLET | Freq: Every morning | ORAL | Status: DC
  Administered 2015-02-15 – 2015-02-20 (×5): 1000 [IU] via ORAL
  Filled 2015-02-15 (×5): qty 1

## 2015-02-15 MED ORDER — TRIAMTERENE-HCTZ 37.5-25 MG PO TABS
0.5000 | ORAL_TABLET | Freq: Every day | ORAL | Status: DC
  Filled 2015-02-15: qty 0.5

## 2015-02-15 MED ORDER — ATORVASTATIN CALCIUM 10 MG PO TABS
10.0000 mg | ORAL_TABLET | Freq: Every day | ORAL | Status: DC
  Administered 2015-02-15 – 2015-02-20 (×5): 10 mg via ORAL
  Filled 2015-02-15 (×5): qty 1

## 2015-02-15 MED ORDER — DONEPEZIL HCL 10 MG PO TABS
10.0000 mg | ORAL_TABLET | Freq: Every day | ORAL | Status: DC
  Administered 2015-02-15 – 2015-02-19 (×5): 10 mg via ORAL
  Filled 2015-02-15 (×5): qty 1

## 2015-02-15 MED ORDER — LOSARTAN POTASSIUM 50 MG PO TABS
100.0000 mg | ORAL_TABLET | Freq: Every day | ORAL | Status: DC
  Administered 2015-02-15: 100 mg via ORAL
  Filled 2015-02-15: qty 2

## 2015-02-15 MED ORDER — ACETAMINOPHEN 650 MG RE SUPP: 650.0000 mg | Freq: Four times a day (QID) | RECTAL | Status: DC | PRN

## 2015-02-15 MED ORDER — ENSURE ENLIVE PO LIQD
237.0000 mL | Freq: Every day | ORAL | Status: DC
  Administered 2015-02-16 – 2015-02-19 (×3): 237 mL via ORAL

## 2015-02-15 MED ORDER — MEMANTINE HCL 5 MG PO TABS: 5.0000 mg | ORAL_TABLET | Freq: Every day | ORAL | Status: DC

## 2015-02-15 MED ORDER — RIVASTIGMINE TARTRATE 3 MG PO CAPS: 3.0000 mg | ORAL_CAPSULE | ORAL | Status: DC

## 2015-02-15 MED ORDER — QUETIAPINE 12.5 MG HALF TABLET
12.5000 mg | ORAL_TABLET | Freq: Every day | ORAL | Status: DC
Start: 2015-02-15 — End: 2015-02-20
  Administered 2015-02-15 – 2015-02-20 (×5): 12.5 mg via ORAL
  Filled 2015-02-15 (×7): qty 1

## 2015-02-15 MED ORDER — PNEUMOCOCCAL VAC POLYVALENT 25 MCG/0.5ML IJ INJ
0.5000 mL | INJECTION | INTRAMUSCULAR | Status: AC
  Administered 2015-02-16: 0.5 mL via INTRAMUSCULAR
  Filled 2015-02-15: qty 0.5

## 2015-02-15 MED ORDER — CITALOPRAM HYDROBROMIDE 20 MG PO TABS
20.0000 mg | ORAL_TABLET | Freq: Every day | ORAL | Status: DC
  Administered 2015-02-15 – 2015-02-20 (×5): 20 mg via ORAL
  Filled 2015-02-15 (×8): qty 1

## 2015-02-15 MED ORDER — LORAZEPAM 0.5 MG PO TABS: 0.5000 mg | ORAL_TABLET | Freq: Two times a day (BID) | ORAL | Status: DC | PRN

## 2015-02-15 MED ORDER — DEXTROSE 5 % IV SOLN
2.0000 g | INTRAVENOUS | Status: DC
  Administered 2015-02-15 – 2015-02-18 (×4): 2 g via INTRAVENOUS
  Filled 2015-02-15 (×5): qty 2

## 2015-02-15 MED ORDER — CHLORHEXIDINE GLUCONATE CLOTH 2 % EX PADS
6.0000 | MEDICATED_PAD | Freq: Every day | CUTANEOUS | Status: AC
  Administered 2015-02-15 – 2015-02-19 (×5): 6 via TOPICAL

## 2015-02-15 MED ORDER — OSELTAMIVIR PHOSPHATE 75 MG PO CAPS: 75.0000 mg | ORAL_CAPSULE | Freq: Two times a day (BID) | ORAL | Status: DC

## 2015-02-15 MED ORDER — ACETAMINOPHEN 325 MG PO TABS
650.0000 mg | ORAL_TABLET | Freq: Two times a day (BID) | ORAL | Status: DC
  Administered 2015-02-15 – 2015-02-20 (×10): 650 mg via ORAL
  Filled 2015-02-15 (×11): qty 2

## 2015-02-15 MED ORDER — SODIUM CHLORIDE 0.9 % IV SOLN
INTRAVENOUS | Status: DC
  Administered 2015-02-15: 05:00:00 via INTRAVENOUS

## 2015-02-15 MED ORDER — ONDANSETRON HCL 4 MG PO TABS: 4.0000 mg | ORAL_TABLET | Freq: Four times a day (QID) | ORAL | Status: DC | PRN

## 2015-02-15 MED ORDER — QUETIAPINE FUMARATE 50 MG PO TABS
50.0000 mg | ORAL_TABLET | Freq: Every day | ORAL | Status: DC
  Administered 2015-02-15 – 2015-02-17 (×3): 50 mg via ORAL
  Filled 2015-02-15 (×5): qty 1

## 2015-02-15 MED ORDER — LABETALOL HCL 100 MG PO TABS: 200.0000 mg | ORAL_TABLET | Freq: Two times a day (BID) | ORAL | Status: DC

## 2015-02-15 MED ORDER — FLUTICASONE PROPIONATE 50 MCG/ACT NA SUSP
2.0000 | Freq: Every day | NASAL | Status: DC
  Administered 2015-02-15 – 2015-02-19 (×5): 2 via NASAL
  Filled 2015-02-15 (×4): qty 16

## 2015-02-15 MED ORDER — SODIUM CHLORIDE 0.9 % IV SOLN
INTRAVENOUS | Status: DC
  Administered 2015-02-15 – 2015-02-16 (×2): via INTRAVENOUS
  Filled 2015-02-15 (×5): qty 1000

## 2015-02-15 MED ORDER — ALBUTEROL SULFATE (2.5 MG/3ML) 0.083% IN NEBU
2.5000 mg | INHALATION_SOLUTION | RESPIRATORY_TRACT | Status: DC | PRN
  Administered 2015-02-17 – 2015-02-18 (×2): 2.5 mg via RESPIRATORY_TRACT
  Filled 2015-02-15 (×3): qty 3

## 2015-02-15 MED ORDER — CLOMIPHENE CITRATE 50 MG PO TABS: 25.0000 mg | ORAL_TABLET | Freq: Every day | ORAL | Status: DC

## 2015-02-15 MED ORDER — POTASSIUM CHLORIDE 10 MEQ/100ML IV SOLN
10.0000 meq | INTRAVENOUS | Status: AC
  Administered 2015-02-15 (×3): 10 meq via INTRAVENOUS
  Filled 2015-02-15 (×3): qty 100

## 2015-02-15 MED ORDER — PANTOPRAZOLE SODIUM 40 MG IV SOLR
40.0000 mg | Freq: Two times a day (BID) | INTRAVENOUS | Status: DC
  Administered 2015-02-15 – 2015-02-18 (×8): 40 mg via INTRAVENOUS
  Filled 2015-02-15 (×9): qty 40

## 2015-02-15 MED ORDER — ONDANSETRON HCL 4 MG/2ML IJ SOLN: 4.0000 mg | Freq: Four times a day (QID) | INTRAMUSCULAR | Status: DC | PRN

## 2015-02-15 MED ORDER — ONDANSETRON HCL 4 MG/2ML IJ SOLN: 4.0000 mg | Freq: Three times a day (TID) | INTRAMUSCULAR | Status: DC | PRN

## 2015-02-15 MED ORDER — LABETALOL HCL 100 MG PO TABS
100.0000 mg | ORAL_TABLET | Freq: Three times a day (TID) | ORAL | Status: DC
  Administered 2015-02-15 – 2015-02-20 (×13): 100 mg via ORAL
  Filled 2015-02-15 (×14): qty 1

## 2015-02-15 MED ORDER — TRAMADOL HCL 50 MG PO TABS
50.0000 mg | ORAL_TABLET | Freq: Two times a day (BID) | ORAL | Status: DC
  Administered 2015-02-15 – 2015-02-20 (×10): 50 mg via ORAL
  Filled 2015-02-15 (×10): qty 1

## 2015-02-15 NOTE — Consult Note (Signed)
Referring Provider:  [facility] Primary Care Physician:  Reymundo Poll, MD Primary Gastroenterologist:  unassigned  Reason for Consultation:   Abdominal pain      HPI: Anne Compton is a 82 y.o. female with a history of vascular dementia, transient cerebrovascular ischemia, breast cancer, and hypertension, who resides at [facility] senior living facility in [place]. She is chronically on Plavix. She reportedly presented to [facility] emergency department with complaints of abdominal pain. While in the emergency room the patient underwent a CT of the abdomen and pelvis which showed nonspecific inflammatory changes around the pancreas and gallbladder the lipase was normal. CT also showed diverticulosis of the colon with diverticula seen at the hepatic flexure. Small amount of inflammatory fluid surrounding the diverticuli may be an extension from inflammatory changes of the duodenum, diverticulitis is less likely but not excluded. Patient had gallbladder sludge without sonographic evidence of acute cholecystitis. Common bile duct measured 6 mm. Blood work revealed a total bili 0.7, alkaline phosphatase 56, ALT 14, AST 26, lipase 17.   Past Medical History  Diagnosis Date  . Vascular dementia   . Hypertension   . Transient cerebrovascular ischemia   . Cancer ([place])   . Breast cancer ([place])   . Stroke [facility])     TIA    Past Surgical History  Procedure Laterality Date  . Joint replacement      Prior to Admission medications   Medication Sig Start Date End Date Taking? Authorizing Provider  acetaminophen (TYLENOL) 325 MG tablet Take 650 mg by mouth 2 (two) times daily. As needed for pain     Yes Historical Provider, MD  atorvastatin (LIPITOR) 10 MG tablet Take 10 mg by mouth at bedtime.    Yes Historical Provider, MD  Cholecalciferol 1000 UNITS tablet Take 1,000 Units by mouth every morning.    Yes Historical Provider, MD  citalopram (CELEXA) 10 MG tablet Take 10  mg by mouth daily.   Yes Historical Provider, MD  clopidogrel (PLAVIX) 75 MG tablet Take 75 mg by mouth daily.     Yes Historical Provider, MD  donepezil (ARICEPT) 10 MG tablet Take 10 mg by mouth at bedtime.   Yes Historical Provider, MD  ferrous sulfate 325 (65 FE) MG tablet Take 325 mg by mouth daily with breakfast.     Yes Historical Provider, MD  fluticasone (FLONASE) 50 MCG/ACT nasal spray Place 2 sprays into both nostrils daily.    Yes Historical Provider, MD  guaifenesin (ROBITUSSIN) 100 MG/5ML syrup Take 200 mg by mouth every 6 (six) hours as needed for cough.   Yes Historical Provider, MD  HYDRALAZINE HCL PO Take 50 mg by mouth 3 (three) times daily.    Yes Historical Provider, MD  labetalol (NORMODYNE) 200 MG tablet Take 100 mg by mouth 3 (three) times daily.    Yes Historical Provider, MD  LORazepam (ATIVAN) 0.5 MG tablet Take 0.5 mg by mouth 2 (two) times daily as needed for anxiety.   Yes Historical Provider, MD  losartan (COZAAR) 100 MG tablet Take 100 mg by mouth daily.     Yes Historical Provider, MD  Multiple Vitamin (MULTIVITAMIN) tablet Take 1 tablet by mouth daily.   Yes Historical Provider, MD  QUEtiapine (SEROQUEL) 25 MG tablet Take 12.5 mg by mouth at bedtime.   Yes Historical Provider, MD  QUEtiapine (SEROQUEL) 50 MG tablet Take 50 mg by mouth at bedtime.   Yes Historical Provider, MD  ranitidine (ZANTAC) 75 MG  tablet Take 75 mg by mouth daily.    Yes Historical Provider, MD  traMADol (ULTRAM) 50 MG tablet Take 50 mg by mouth 2 (two) times daily. For pain   Yes Historical Provider, MD  triamterene-hydrochlorothiazide (MAXZIDE-25) 37.5-25 MG per tablet Take 1 tablet by mouth daily.    Yes Historical Provider, MD    Current Facility-Administered Medications  Medication Dose Route Frequency Provider Last Rate Last Dose  . acetaminophen (TYLENOL) tablet 650 mg  650 mg Oral Q6H PRN Norval Morton, MD       Or  . acetaminophen (TYLENOL) suppository 650 mg  650 mg Rectal Q6H  PRN Norval Morton, MD      . acetaminophen (TYLENOL) tablet 650 mg  650 mg Oral BID Norval Morton, MD   650 mg at 02/15/15 0926  . albuterol (PROVENTIL) (2.5 MG/3ML) 0.083% nebulizer solution 2.5 mg  2.5 mg Nebulization Q2H PRN Norval Morton, MD      . atorvastatin (LIPITOR) tablet 10 mg  10 mg Oral Daily Norval Morton, MD   10 mg at 02/15/15 0927  . cefTRIAXone (ROCEPHIN) 2 g in dextrose 5 % 50 mL IVPB  2 g Intravenous Q24H Norval Morton, MD   2 g at 02/15/15 0506  . Chlorhexidine Gluconate Cloth 2 % PADS 6 each  6 each Topical Q0600 Norval Morton, MD   6 each at 02/15/15 0533  . cholecalciferol (VITAMIN D) tablet 1,000 Units  1,000 Units Oral q morning - 10a Norval Morton, MD   1,000 Units at 02/15/15 0926  . citalopram (CELEXA) tablet 20 mg  20 mg Oral Daily Norval Morton, MD   20 mg at 02/15/15 0926  . clopidogrel (PLAVIX) tablet 75 mg  75 mg Oral Daily Norval Morton, MD   75 mg at 02/15/15 0927  . donepezil (ARICEPT) tablet 10 mg  10 mg Oral QHS Rondell A Tamala Julian, MD      . famotidine (PEPCID) tablet 10 mg  10 mg Oral Daily Norval Morton, MD   10 mg at 02/15/15 0926  . feeding supplement (ENSURE ENLIVE) (ENSURE ENLIVE) liquid 237 mL  237 mL Oral Q1500 Rondell A Tamala Julian, MD      . ferrous sulfate tablet 325 mg  325 mg Oral Q breakfast Norval Morton, MD   325 mg at 02/15/15 0927  . fluticasone (FLONASE) 50 MCG/ACT nasal spray 2 spray  2 spray Each Nare Daily Rondell A Tamala Julian, MD      . heparin injection 5,000 Units  5,000 Units Subcutaneous 3 times per day Norval Morton, MD   5,000 Units at 02/15/15 0508  . labetalol (NORMODYNE) tablet 100 mg  100 mg Oral TID Norval Morton, MD   100 mg at 02/15/15 0926  . LORazepam (ATIVAN) tablet 0.5 mg  0.5 mg Oral BID PRN Norval Morton, MD      . multivitamin with minerals tablet 1 tablet  1 tablet Oral Daily Norval Morton, MD   1 tablet at 02/15/15 0926  . mupirocin ointment (BACTROBAN) 2 % 1 application  1 application Nasal BID  Norval Morton, MD   1 application at [phone]  . ondansetron (ZOFRAN) tablet 4 mg  4 mg Oral Q6H PRN Norval Morton, MD       Or  . ondansetron (ZOFRAN) injection 4 mg  4 mg Intravenous Q6H PRN Norval Morton, MD      . Derrill Memo  ON 02/16/2015] pneumococcal 23 valent vaccine (PNU-IMMUNE) injection 0.5 mL  0.5 mL Intramuscular Tomorrow-1000 Shanon Brow Tat, MD      . potassium chloride 10 mEq in 100 mL IVPB  10 mEq Intravenous Q1 Hr x 3 David Tat, MD      . QUEtiapine (SEROQUEL) tablet 12.5 mg  12.5 mg Oral Daily Rondell Charmayne Sheer, MD   12.5 mg at 02/15/15 1102  . QUEtiapine (SEROQUEL) tablet 50 mg  50 mg Oral QHS Rondell A Smith, MD      . sodium chloride 0.9 % 1,000 mL with potassium chloride 20 mEq infusion   Intravenous Continuous David Tat, MD      . sodium chloride 0.9 % injection 3 mL  3 mL Intravenous Q12H Rondell A Tamala Julian, MD   3 mL at 02/15/15 0300  . traMADol (ULTRAM) tablet 50 mg  50 mg Oral BID Norval Morton, MD   50 mg at 02/15/15 [id number]    Allergies as of 02/14/2015 - Review Complete 02/14/2015  Allergen Reaction Noted  . Penicillins Other (See Comments) 08/05/2010    No family history on file.  Social History   Social History  . Marital Status: Widowed    Spouse Name: N/A  . Number of Children: N/A  . Years of Education: N/A   Occupational History  . Not on file.   Social History Main Topics  . Smoking status: Unknown If Ever Smoked  . Smokeless tobacco: Not on file  . Alcohol Use: No     Comment: pt lives in [place]  . Drug Use: No  . Sexual Activity: Not on file   Other Topics Concern  . Not on file   Social History Narrative    Review of Systems: Unable to obtain as patient is demented and nonverbal.  Physical Exam: Vital signs in last 24 hours: Temp:  [97.5 F (36.4 C)-99.7 F (37.6 C)] 97.5 F (36.4 C) (01/21 0629) Pulse Rate:  [85-91] 89 (01/21 0629) Resp:  [16-22] 20 (01/21 0629) BP: (111-184)/(80-97) 144/82 mmHg (01/21 0629) SpO2:  [93 %-97 %]  97 % (01/21 0629) Weight:  [160 lb 7.9 oz (72.8 kg)] 160 lb 7.9 oz (72.8 kg) (01/21 0159) Last BM Date:  (PTA) General:  Elderly African-American female who was sleeping but is easily awakened and appears in no acute distress. Head:  Normocephalic and atraumatic. Eyes:  Sclera clear, no icterus Conjunctiva pink. Ears:  Normal auditory acuity. Nose:  No deformity, discharge,  or lesions. Mouth:  No deformity or lesions.   Neck:  Supple; no masses or thyromegaly. Lungs:  Clear throughout to auscultation.   No wheezes, crackles, or rhonchi.  Heart:  Regular rate and rhythm, 2/6 systolic murmur Abdomen:  Soft, nondistended, patient winces with palpation in the epigastric area and with palpation in the left lower quadrant. BS active,nonpalp mass or hsm.   Rectal:  Deferred  Msk:  Symmetrical without gross deformities. . Pulses:  Normal pulses noted. Extremities:  Without clubbing or edema. Neurologic: Alert, nonverbal Skin:  Intact without significant lesions or rashes.. Psych: Alert, nonverbal.  Intake/Output from previous day: 01/20 0701 - 01/21 0700 In: 153.8 [I.V.:103.8; IV Piggyback:50] Out: -  Intake/Output this shift:    Lab Results:  Recent Labs  02/14/15 2030 02/15/15 0552  WBC 15.4* 15.7*  HGB 9.4* 8.3*  HCT 30.5* 25.8*  PLT 307 271   BMET  Recent Labs  02/14/15 2030 02/15/15 0552  NA 136 139  K 3.6 3.2*  CL 100*  101  CO2 27 29  GLUCOSE 163* 154*  BUN 24* 20  CREATININE 1.31* 1.28*  CALCIUM 9.0 9.0   LFT  Recent Labs  02/14/15 2030  PROT 7.2  ALBUMIN 3.5  AST 26  ALT 14  ALKPHOS 56  BILITOT 0.7      Studies/Results: US Abdomen Complete  02/15/2015  CLINICAL DATA:  82 year old female with abdominal pain EXAM: ABDOMEN ULTRASOUND COMPLETE COMPARISON:  CT dated 02/14/2015 FINDINGS: Gallbladder: There is a sludge within the gallbladder. No gallbladder wall thickening or pericholecystic fluid. Negative sonographic Murphy's sign. Common bile duct:  Diameter: 6 mm Liver: Trace free fluid noted around the liver. The liver is otherwise unremarkable. IVC: No abnormality visualized. Pancreas: Not well visualized. Spleen: Size and appearance within normal limits. Right Kidney: Length: 10 cm the right kidney appears echogenic. No hydronephrosis. Left Kidney: Length: 8 cm. The left kidney also appears mildly echogenic. No hydronephrosis. Abdominal aorta: The aorta measures up to 2.5 cm on limited evaluation. Other findings: None. IMPRESSION: Gallbladder sludge without sonographic evidence of acute cholecystitis. No hydronephrosis. Electronically Signed   By: Anner Crete M.D.   On: 02/15/2015 04:57   Ct Abdomen Pelvis W Contrast  02/15/2015  ADDENDUM REPORT: 02/15/2015 04:59 ADDENDUM: There is diverticulosis of the colon with diverticula seen at the hepatic flexure. Small amount of inflammatory fluid surrounding the diverticula may be extension from the inflammatory changes of the duodenum. Diverticulitis is less likely but not excluded. Clinical correlation is recommended. Electronically Signed   By: Anner Crete M.D.   On: 02/15/2015 04:59  02/15/2015  CLINICAL DATA:  82 year old female with abdominal pain. EXAM: CT ABDOMEN AND PELVIS WITH CONTRAST TECHNIQUE: Multidetector CT imaging of the abdomen and pelvis was performed using the standard protocol following bolus administration of intravenous contrast. CONTRAST:  87mL OMNIPAQUE IOHEXOL 300 MG/ML  SOLN COMPARISON:  CT dated 02/08/2013 FINDINGS: Evaluation of the study is limited due to streak artifact caused by patient's arms. There is minimal bibasilar dependent atelectatic changes. Small left posterior diaphragmatic defect with herniation of small amount of abdominal fat compatible with a Bochdalek hernia. No intra-abdominal free air or free fluid. The gallbladder is distended. There is stranding in the fat surrounding the gallbladder and within the forehead status. No definite calcified stone  identified within the gallbladder. There is an ill-defined 7 mm high attenuating focus (series 3, image 25 and coronal series 6, image 45) which may represent volume averaging artifact with adjacent vessel or stone within the neck of the gallbladder or in the cystic duct. Ultrasound is recommended for better evaluation of the gallbladder. There is inflammatory changes of the and uncinate process of the pancreas as well as inflammatory changes of the duodenal C-loop may represent pancreatitis versus duodenitis. Correlation with pancreatic enzymes recommended. No drainable fluid collection/abscess identified. The spleen, adrenal glands appear unremarkable. There is mild bilateral renal atrophy. There is no hydronephrosis on either side. The visualized ureters appear unremarkable. The urinary bladder is collapsed. There is diffuse thickening of the bladder wall. Cystitis or an infiltrative process is not excluded. Correlation with urinalysis and urine cytology and further evaluation with cystoscopy if clinically indicated recommended. There is calcification of the uterus compatible with fibroids. There is extensive sigmoid diverticulosis without active inflammatory changes. There is no evidence of bowel obstruction. A moderate size hiatal hernia is seen. There is inflammatory changes of the distal stomach and duodenal C-loop as described may represent gastroenteritis or be related to pancreatitis. A 2.0 x 1.7 cm  nodular soft tissue density in the region of the duodenal bulb may represent oral content within the lumen. The soft tissue lesion is not excluded. Upper GI study or endoscopy is recommended for better evaluation. There is aortoiliac atherosclerotic disease. The abdominal aorta and IVC appear patent. No portal venous gas identified. There is no adenopathy. There is a small fat containing umbilical hernia. The abdominal wall soft tissues are grossly unremarkable. There is degenerative changes of the spine. No  acute fracture. IMPRESSION: Inflammatory changes of the porta hepaticus, surrounding the head and uncinate process of the pancreas and duodenal C-loop. Findings may represent pancreatitis or gastroduodenitis. Clinical correlation is recommended. Ill-defined high attenuating area along the course of the cystic duct may be volume averaging artifact with the adjacent vessels or represent stone within the cystic duct. Ultrasound is recommended for better evaluation of the gallbladder. Soft tissue appearing structure within the region of the duodenal bulb may represent oral content, or luminal fold. The soft tissue lesion is not excluded. Endoscopy or upper GI study may provide better evaluation. Diffuse thickening of the bladder wall may represent cystitis or an infiltrative process. Correlation with urinalysis and further evaluation with endoscopy if clinically indicated recommended. Sigmoid diverticulosis.  No evidence of bowel obstruction. Electronically Signed: By: Anner Crete M.D. On: 02/14/2015 22:01   Dg Chest Port 1 View  02/14/2015  CLINICAL DATA:  Right lower quadrant pain, dementia EXAM: PORTABLE CHEST 1 VIEW COMPARISON:  11/03/2014 FINDINGS: Cardiomediastinal silhouette is stable. No acute infiltrate or pleural effusion. No pulmonary edema. Elevation of the right hemidiaphragm again noted. IMPRESSION: No active disease. Elevation of the right hemidiaphragm again noted. Electronically Signed   By: Lahoma Crocker M.D.   On: 02/14/2015 23:21    IMPRESSION/PLAN: 82 year old female with a history of vascular dementia TIA on Plavix, presenting from [facility] senior living facility with complaints of abdominal pain. CT with soft tissue appearing structure within the region of the duodenal bulb, soft tissue lesion not excluded. Also noted were inflammatory changes of the porta hepaticus, surrounding the head and uncinate process of the pancreas and  duodenal C-loop. Findings may represent pancreatitis or  gastroduodenitis. LFTs and lipase normal. Will likely need EGD for further evaluation, however we will review with attending as to timing as patient is currently on Plavix. Would start on PPI. Obtain stool for occult blood. Urine culture pending, currently on Rocephin for possible UTI. Anemia. Check stool for occult blood. Patient's hemoglobin 11/03/2014 was 9.7 with an MCV of 90.5.    Hvozdovic, Deloris Ping 02/15/2015,  Pager [phone]  Mon-Fri 8a-5p [phone] after 5p, weekends, holidays    [facility] Attending   I have taken an interval history, reviewed the chart and examined the patient. I agree with the Advanced Practitioner's note, impression and recommendations.    At this point most likely dx is a duodenal ulcer. Endoscopy would not change Tx now - so will Tx for ulcer and reassess re: possible endoscopy. Hold Plavix given bleeding risk Will follow  Gatha Mayer, MD, [facility] [phone] (pager) [phone] after 5 PM, weekends and holidays  02/15/2015 2:39 PM

## 2015-02-15 NOTE — Progress Notes (Signed)
PROGRESS NOTE  Anne Compton [id number] DOB: Dec 22, 1927 DOA: 02/14/2015 PCP: Reymundo Poll, MD Brief History 82 year old female with medical history of hyperlipidemia, hypertension, stroke, dementia coming from a nursing home presents with abdominal pain of uncertain duration. Unfortunately, the patient is unable to provide any history secondary to her dementia and encephalopathy. This history is obtained from review of the medical record in speaking with the patient's son, [name]. At baseline, the patient is wheelchair bound, and is able to speak in full sentences although remains pleasantly confused. She is normally on a regular diet without any dysphagia restrictions. Apparently, the patient was noted to have abdominal pain on her right side and epigastric area by the nursing facility personnel. To the son's knowledge, she has not had any problems with fevers, chills, chest pain, shortness breath, vomiting, diarrhea, hematochezia, melena. Upon admission, the patient was noted to have serum creatinine 1.31 and a WBC 15.4. Initial CT of the abdomen and pelvis revealed inflammatory changes about the uncinate process of the pancreas as well as duodenal C-loop. The patient was started on intravenous antibiotics for possible UTI and intravenous fluids. Assessment/Plan: Acute encephalopathy -Suspect underlying infectious process as well as dehydration -Blood cultures have been obtained, but these were obtained after the patient received ceftriaxone -Follow up urine cultures -TSH -Serum B12 -Ammonia Abdominal pain with leukocytosis -01/20/17CT abdomen and pelvis inflammatory changes surrounding the uncinate process of the pancreas as well as duodenal C-loop; distended gallbladder with fat stranding -Abdominal ultrasound shows GB sludge, but negative for gallbladder wall thickening or pericholecystic fluid -lipase and LFTs normal -consulted GI -Lactic acid 0.70 Pyuria -continue  ceftriaxone pending culture data  Hypertension -Continue labetalol -Hold losartan and Maxzide in the setting of dehydration and CKD History of stroke  -Continue Plavix  Dementia with behavioral disturbance -Continue Aricept  -Continue Seroquel at bedtime  Hyperlipidemia  -Continue statin  Hypokalemia  -Replete  -Check magnesium    Family Communication:   Son updated on phone 1/21 Disposition Plan:   Home when medically stable Total time 60 min--1120-1220     Procedures/Studies: US Abdomen Complete  02/15/2015  CLINICAL DATA:  82 year old female with abdominal pain EXAM: ABDOMEN ULTRASOUND COMPLETE COMPARISON:  CT dated 02/14/2015 FINDINGS: Gallbladder: There is a sludge within the gallbladder. No gallbladder wall thickening or pericholecystic fluid. Negative sonographic Murphy's sign. Common bile duct: Diameter: 6 mm Liver: Trace free fluid noted around the liver. The liver is otherwise unremarkable. IVC: No abnormality visualized. Pancreas: Not well visualized. Spleen: Size and appearance within normal limits. Right Kidney: Length: 10 cm the right kidney appears echogenic. No hydronephrosis. Left Kidney: Length: 8 cm. The left kidney also appears mildly echogenic. No hydronephrosis. Abdominal aorta: The aorta measures up to 2.5 cm on limited evaluation. Other findings: None. IMPRESSION: Gallbladder sludge without sonographic evidence of acute cholecystitis. No hydronephrosis. Electronically Signed   By: Anner Crete M.D.   On: 02/15/2015 04:57   Ct Abdomen Pelvis W Contrast  02/15/2015  ADDENDUM REPORT: 02/15/2015 04:59 ADDENDUM: There is diverticulosis of the colon with diverticula seen at the hepatic flexure. Small amount of inflammatory fluid surrounding the diverticula may be extension from the inflammatory changes of the duodenum. Diverticulitis is less likely but not excluded. Clinical correlation is recommended. Electronically Signed   By: Anner Crete M.D.   On:  02/15/2015 04:59  02/15/2015  CLINICAL DATA:  82 year old female with abdominal pain. EXAM: CT ABDOMEN AND PELVIS WITH CONTRAST TECHNIQUE:  Multidetector CT imaging of the abdomen and pelvis was performed using the standard protocol following bolus administration of intravenous contrast. CONTRAST:  10mL OMNIPAQUE IOHEXOL 300 MG/ML  SOLN COMPARISON:  CT dated 02/08/2013 FINDINGS: Evaluation of the study is limited due to streak artifact caused by patient's arms. There is minimal bibasilar dependent atelectatic changes. Small left posterior diaphragmatic defect with herniation of small amount of abdominal fat compatible with a Bochdalek hernia. No intra-abdominal free air or free fluid. The gallbladder is distended. There is stranding in the fat surrounding the gallbladder and within the forehead status. No definite calcified stone identified within the gallbladder. There is an ill-defined 7 mm high attenuating focus (series 3, image 25 and coronal series 6, image 45) which may represent volume averaging artifact with adjacent vessel or stone within the neck of the gallbladder or in the cystic duct. Ultrasound is recommended for better evaluation of the gallbladder. There is inflammatory changes of the and uncinate process of the pancreas as well as inflammatory changes of the duodenal C-loop may represent pancreatitis versus duodenitis. Correlation with pancreatic enzymes recommended. No drainable fluid collection/abscess identified. The spleen, adrenal glands appear unremarkable. There is mild bilateral renal atrophy. There is no hydronephrosis on either side. The visualized ureters appear unremarkable. The urinary bladder is collapsed. There is diffuse thickening of the bladder wall. Cystitis or an infiltrative process is not excluded. Correlation with urinalysis and urine cytology and further evaluation with cystoscopy if clinically indicated recommended. There is calcification of the uterus compatible with  fibroids. There is extensive sigmoid diverticulosis without active inflammatory changes. There is no evidence of bowel obstruction. A moderate size hiatal hernia is seen. There is inflammatory changes of the distal stomach and duodenal C-loop as described may represent gastroenteritis or be related to pancreatitis. A 2.0 x 1.7 cm nodular soft tissue density in the region of the duodenal bulb may represent oral content within the lumen. The soft tissue lesion is not excluded. Upper GI study or endoscopy is recommended for better evaluation. There is aortoiliac atherosclerotic disease. The abdominal aorta and IVC appear patent. No portal venous gas identified. There is no adenopathy. There is a small fat containing umbilical hernia. The abdominal wall soft tissues are grossly unremarkable. There is degenerative changes of the spine. No acute fracture. IMPRESSION: Inflammatory changes of the porta hepaticus, surrounding the head and uncinate process of the pancreas and duodenal C-loop. Findings may represent pancreatitis or gastroduodenitis. Clinical correlation is recommended. Ill-defined high attenuating area along the course of the cystic duct may be volume averaging artifact with the adjacent vessels or represent stone within the cystic duct. Ultrasound is recommended for better evaluation of the gallbladder. Soft tissue appearing structure within the region of the duodenal bulb may represent oral content, or luminal fold. The soft tissue lesion is not excluded. Endoscopy or upper GI study may provide better evaluation. Diffuse thickening of the bladder wall may represent cystitis or an infiltrative process. Correlation with urinalysis and further evaluation with endoscopy if clinically indicated recommended. Sigmoid diverticulosis.  No evidence of bowel obstruction. Electronically Signed: By: Anner Crete M.D. On: 02/14/2015 22:01   Dg Chest Port 1 View  02/14/2015  CLINICAL DATA:  Right lower quadrant  pain, dementia EXAM: PORTABLE CHEST 1 VIEW COMPARISON:  11/03/2014 FINDINGS: Cardiomediastinal silhouette is stable. No acute infiltrate or pleural effusion. No pulmonary edema. Elevation of the right hemidiaphragm again noted. IMPRESSION: No active disease. Elevation of the right hemidiaphragm again noted. Electronically Signed   By:  Lahoma Crocker M.D.   On: 02/14/2015 23:21        Subjective: Patient opens eyes and occasionally speaks in 1 word sentences. No reports of vomiting, diarrhea, respiratory distress, control pain.  Objective: Filed Vitals:   02/14/15 2330 02/15/15 0001 02/15/15 0159 02/15/15 0629  BP: 151/86 177/89 162/80 144/82  Pulse: 85 85 90 89  Temp:   99.7 F (37.6 C) 97.5 F (36.4 C)  TempSrc:   Oral Oral  Resp: 20 18 20 20   Height:   5\' 3"  (1.6 m)   Weight:   72.8 kg (160 lb 7.9 oz)   SpO2: 94% 93% 96% 97%    Intake/Output Summary (Last 24 hours) at 02/15/15 1222 Last data filed at 02/15/15 0939  Gross per 24 hour  Intake 153.75 ml  Output      0 ml  Net 153.75 ml   Weight change:  Exam:   General:  Pt is alert, follows commands appropriately, not in acute distress  HEENT: No icterus, No thrush, No neck mass, [place]/AT  Cardiovascular: RRR, S1/S2, no rubs, no gallops  Respiratory: poor respiratory effort but clear to auscultation. No wheezing. Good air movement  Abdomen: Soft/+BS, RUQ and epigastric tender without rebound, non distended, no guarding  Extremities: trace LE edema, No lymphangitis, No petechiae, No rashes, no synovitis  Data Reviewed: Basic Metabolic Panel:  Recent Labs Lab 02/14/15 2030 02/15/15 0552  NA 136 139  K 3.6 3.2*  CL 100* 101  CO2 27 29  GLUCOSE 163* 154*  BUN 24* 20  CREATININE 1.31* 1.28*  CALCIUM 9.0 9.0   Liver Function Tests:  Recent Labs Lab 02/14/15 2030  AST 26  ALT 14  ALKPHOS 56  BILITOT 0.7  PROT 7.2  ALBUMIN 3.5    Recent Labs Lab 02/14/15 2030  LIPASE 17   No results for input(s):  AMMONIA in the last 168 hours. CBC:  Recent Labs Lab 02/14/15 2030 02/15/15 0552  WBC 15.4* 15.7*  NEUTROABS 13.8*  --   HGB 9.4* 8.3*  HCT 30.5* 25.8*  MCV 85.7 83.5  PLT 307 271   Cardiac Enzymes:  Recent Labs Lab 02/14/15 2031  TROPONINI 0.03   BNP: Invalid input(s): POCBNP CBG: No results for input(s): GLUCAP in the last 168 hours.  Recent Results (from the past 240 hour(s))  Urine culture     Status: None (Preliminary result)   Collection Time: 02/14/15  8:20 PM  Result Value Ref Range Status   Specimen Description URINE, CATHETERIZED  Final   Special Requests NONE  Final   Culture   Final    NO GROWTH < 12 HOURS Performed at [facility]    Report Status PENDING  Incomplete  MRSA PCR Screening     Status: Abnormal   Collection Time: 02/15/15  2:16 AM  Result Value Ref Range Status   MRSA by PCR POSITIVE (A) NEGATIVE Final    Comment:        The GeneXpert MRSA Assay (FDA approved for NASAL specimens only), is one component of a comprehensive MRSA colonization surveillance program. It is not intended to diagnose MRSA infection nor to guide or monitor treatment for MRSA infections. RESULT CALLED TO, READ BACK BY AND VERIFIED WITH: C DULIAO,RN @0518  02/15/15 MKELLY      Scheduled Meds: . acetaminophen  650 mg Oral BID  . atorvastatin  10 mg Oral Daily  . cefTRIAXone (ROCEPHIN)  IV  2 g Intravenous Q24H  . Chlorhexidine Gluconate  Cloth  6 each Topical [id number]  . cholecalciferol  1,000 Units Oral q morning - 10a  . citalopram  20 mg Oral Daily  . clopidogrel  75 mg Oral Daily  . donepezil  10 mg Oral QHS  . famotidine  10 mg Oral Daily  . feeding supplement (ENSURE ENLIVE)  237 mL Oral Q1500  . ferrous sulfate  325 mg Oral Q breakfast  . fluticasone  2 spray Each Nare Daily  . heparin  5,000 Units Subcutaneous 3 times per day  . labetalol  100 mg Oral TID  . multivitamin with minerals  1 tablet Oral Daily  . mupirocin ointment  1  application Nasal BID  . [START ON 02/16/2015] pneumococcal 23 valent vaccine  0.5 mL Intramuscular Tomorrow-1000  . potassium chloride  10 mEq Intravenous Q1 Hr x 3  . QUEtiapine  12.5 mg Oral Daily  . QUEtiapine  50 mg Oral QHS  . sodium chloride  3 mL Intravenous Q12H  . traMADol  50 mg Oral BID   Continuous Infusions: . sodium chloride 0.9 % 1,000 mL with potassium chloride 20 mEq infusion       Ryle Buscemi, DO  [facility] Pager [phone]  If 7PM-7AM, please contact night-coverage [email or web address] Password [email or web address] 02/15/2015, 12:22 PM   LOS: 0 days

## 2015-02-15 NOTE — H&P (Signed)
[facility] History and Physical  Anne Compton [id number] DOB: 1927/06/20 DOA: 02/14/2015  Referring physician: Transferred from [facility] [place] PCP: Reymundo Poll, MD   Chief Complaint: Abdominal pain  HPI:   Anne Compton is a 82 y.o. Female with past medical history of vascular dementia, hypertension; who presented to  [facility] emergency department with reported complaints of abdominal pain from [facility] living facility.  Patient has significant dementia and is unable to provide her own history.  Upon arrival to the emergency department patient was evaluated with the CT of abdomen and pelvis which showed nonspecific inflammatory changes around the pancreas and gallbladder although initial lipase was seen to be within normal limits. WBC 15.4, UA suggestive of possible urinary tract infection, but lactate acid and troponin were seen to be within normal limits.  She was then transferred to [facility] for further workup an admission.  Review of Systems  Unable to perform ROS: dementia      Past Medical History  Diagnosis Date  . Vascular dementia   . Hypertension   . Transient cerebrovascular ischemia   . Cancer ([place])   . Breast cancer ([place])   . Stroke [facility])     TIA     Past Surgical History  Procedure Laterality Date  . Joint replacement        Social History:  reports that she does not drink alcohol or use illicit drugs. Her tobacco history is not on file.   Allergies  Allergen Reactions  . Penicillins Other (See Comments)    unknown    No family history on file.     Prior to Admission medications   Medication Sig Start Date End Date Taking? Authorizing Provider  acetaminophen (TYLENOL) 325 MG tablet Take 650 mg by mouth 2 (two) times daily. As needed for pain      Historical Provider, MD  atorvastatin (LIPITOR) 10 MG tablet Take 5 mg by mouth at bedtime.     Historical Provider, MD  Cholecalciferol 1000 UNITS  tablet Take 1,000 Units by mouth every morning.     Historical Provider, MD  citalopram (CELEXA) 20 MG tablet Take 20 mg by mouth daily.     Historical Provider, MD  clomiPHENE (CLOMID) 50 MG tablet Take 25 mg by mouth at bedtime.    Historical Provider, MD  clopidogrel (PLAVIX) 75 MG tablet Take 75 mg by mouth daily.      Historical Provider, MD  donepezil (ARICEPT) 10 MG tablet Take 10 mg by mouth at bedtime.    Historical Provider, MD  feeding supplement (ENSURE IMMUNE HEALTH) LIQD Take 237 mLs by mouth daily at 3 pm. At 2 pm    Historical Provider, MD  ferrous sulfate 325 (65 FE) MG tablet Take 325 mg by mouth daily with breakfast.      Historical Provider, MD  fluticasone (FLONASE) 50 MCG/ACT nasal spray Place into both nostrils daily.    Historical Provider, MD  HYDRALAZINE HCL PO Take 75 mg by mouth daily.    Historical Provider, MD  labetalol (NORMODYNE) 200 MG tablet Take 200 mg by mouth 2 (two) times daily. At 8 am and 8 pm for blood pressure    Historical Provider, MD  LORazepam (ATIVAN) 0.5 MG tablet Take 0.5 mg by mouth 3 (three) times daily as needed. For agitation or anxiety    Historical Provider, MD  losartan (COZAAR) 100 MG tablet Take 100 mg by mouth daily.  Historical Provider, MD  memantine (NAMENDA) 5 MG tablet Take 5 mg by mouth daily. Discontinue on 06/08/11    Historical Provider, MD  Multiple Vitamin (MULTIVITAMIN) tablet Take 1 tablet by mouth daily.    Historical Provider, MD  oseltamivir (TAMIFLU) 75 MG capsule Take 1 capsule (75 mg total) by mouth every 12 (twelve) hours. 02/20/13   Anne Boss, MD  PRESCRIPTION MEDICATION Apply 1 mL topically every 6 (six) hours as needed. Lorazepam 0.5mg /49ml gel         For agitation and anxiety     Historical Provider, MD  QUEtiapine (SEROQUEL) 25 MG tablet Take 12.5 mg by mouth at bedtime.    Historical Provider, MD  ranitidine (ZANTAC) 75 MG tablet Take 75 mg by mouth daily.     Historical Provider, MD  rivastigmine (EXELON) 3  MG capsule Take 3 mg by mouth every other day. For 2 weeks starting 05/18/11    Historical Provider, MD  traMADol (ULTRAM) 50 MG tablet Take 50 mg by mouth 2 (two) times daily. For pain    Historical Provider, MD  triamterene-hydrochlorothiazide (MAXZIDE-25) 37.5-25 MG per tablet Take 0.5 tablets by mouth daily.     Historical Provider, MD     Physical Exam: Filed Vitals:   02/14/15 2300 02/14/15 2330 02/15/15 0001 02/15/15 0159  BP: 164/87 151/86 177/89 162/80  Pulse: 86 85 85 90  Temp: 98.4 F (36.9 C)   99.7 F (37.6 C)  TempSrc: Oral   Oral  Resp: 22 20 18 20   Height:    5\' 3"  (1.6 m)  Weight:    72.8 kg (160 lb 7.9 oz)  SpO2: 94% 94% 93% 96%     Constitutional: Vital signs reviewed.  Elderly female who is alert and appears to be in no acute distress Head: Normocephalic and atraumatic  Ear: TM normal bilaterally  Mouth: no erythema or exudates, MMM  Eyes: PERRL, EOMI, conjunctivae normal, No scleral icterus.  Neck: Supple, Trachea midline normal ROM, No JVD, mass, thyromegaly, or carotid bruit present.  Cardiovascular: RRR,positive systolic ejection murmur, pulses symmetric and intact bilaterally  Pulmonary/Chest: CTAB, no wheezes, rales, or rhonchi  Abdominal:  Tenderness to palpation , bowel sounds,  positive rebound tenderness and some guarding on the right. GU: no CVA tenderness Musculoskeletal: No joint deformities, erythema, or stiffness, ROM full and no nontender Ext: no edema and no cyanosis, pulses palpable bilaterally (DP and PT)  Hematology: no cervical, inginal, or axillary adenopathy.  Neurological: A&O x3, Strenght is normal and symmetric bilaterally, cranial nerve II-XII are grossly intact, no focal motor deficit, sensory intact to light touch bilaterally.  Skin: Warm, dry and intact. No rash, cyanosis, or clubbing.  Psychiatric:  Abnormal cognition and memory. Will follow some simple commands     Data Review   Micro Results No results found for this or  any previous visit (from the past 240 hour(s)).  Radiology Reports Ct Abdomen Pelvis W Contrast  02/14/2015  CLINICAL DATA:  82 year old female with abdominal pain. EXAM: CT ABDOMEN AND PELVIS WITH CONTRAST TECHNIQUE: Multidetector CT imaging of the abdomen and pelvis was performed using the standard protocol following bolus administration of intravenous contrast. CONTRAST:  75mL OMNIPAQUE IOHEXOL 300 MG/ML  SOLN COMPARISON:  CT dated 02/08/2013 FINDINGS: Evaluation of the study is limited due to streak artifact caused by patient's arms. There is minimal bibasilar dependent atelectatic changes. Small left posterior diaphragmatic defect with herniation of small amount of abdominal fat compatible with a Bochdalek hernia. No intra-abdominal  free air or free fluid. The gallbladder is distended. There is stranding in the fat surrounding the gallbladder and within the forehead status. No definite calcified stone identified within the gallbladder. There is an ill-defined 7 mm high attenuating focus (series 3, image 25 and coronal series 6, image 45) which may represent volume averaging artifact with adjacent vessel or stone within the neck of the gallbladder or in the cystic duct. Ultrasound is recommended for better evaluation of the gallbladder. There is inflammatory changes of the and uncinate process of the pancreas as well as inflammatory changes of the duodenal C-loop may represent pancreatitis versus duodenitis. Correlation with pancreatic enzymes recommended. No drainable fluid collection/abscess identified. The spleen, adrenal glands appear unremarkable. There is mild bilateral renal atrophy. There is no hydronephrosis on either side. The visualized ureters appear unremarkable. The urinary bladder is collapsed. There is diffuse thickening of the bladder wall. Cystitis or an infiltrative process is not excluded. Correlation with urinalysis and urine cytology and further evaluation with cystoscopy if clinically  indicated recommended. There is calcification of the uterus compatible with fibroids. There is extensive sigmoid diverticulosis without active inflammatory changes. There is no evidence of bowel obstruction. A moderate size hiatal hernia is seen. There is inflammatory changes of the distal stomach and duodenal C-loop as described may represent gastroenteritis or be related to pancreatitis. A 2.0 x 1.7 cm nodular soft tissue density in the region of the duodenal bulb may represent oral content within the lumen. The soft tissue lesion is not excluded. Upper GI study or endoscopy is recommended for better evaluation. There is aortoiliac atherosclerotic disease. The abdominal aorta and IVC appear patent. No portal venous gas identified. There is no adenopathy. There is a small fat containing umbilical hernia. The abdominal wall soft tissues are grossly unremarkable. There is degenerative changes of the spine. No acute fracture. IMPRESSION: Inflammatory changes of the porta hepaticus, surrounding the head and uncinate process of the pancreas and duodenal C-loop. Findings may represent pancreatitis or gastroduodenitis. Clinical correlation is recommended. Ill-defined high attenuating area along the course of the cystic duct may be volume averaging artifact with the adjacent vessels or represent stone within the cystic duct. Ultrasound is recommended for better evaluation of the gallbladder. Soft tissue appearing structure within the region of the duodenal bulb may represent oral content, or luminal fold. The soft tissue lesion is not excluded. Endoscopy or upper GI study may provide better evaluation. Diffuse thickening of the bladder wall may represent cystitis or an infiltrative process. Correlation with urinalysis and further evaluation with endoscopy if clinically indicated recommended. Sigmoid diverticulosis.  No evidence of bowel obstruction. Electronically Signed   By: Anner Crete M.D.   On: 02/14/2015 22:01    Dg Chest Port 1 View  02/14/2015  CLINICAL DATA:  Right lower quadrant pain, dementia EXAM: PORTABLE CHEST 1 VIEW COMPARISON:  11/03/2014 FINDINGS: Cardiomediastinal silhouette is stable. No acute infiltrate or pleural effusion. No pulmonary edema. Elevation of the right hemidiaphragm again noted. IMPRESSION: No active disease. Elevation of the right hemidiaphragm again noted. Electronically Signed   By: Lahoma Crocker M.D.   On: 02/14/2015 23:21     CBC  Recent Labs Lab 02/14/15 2030  WBC 15.4*  HGB 9.4*  HCT 30.5*  PLT 307  MCV 85.7  MCH 26.4  MCHC 30.8  RDW 14.8  LYMPHSABS 0.8  MONOABS 0.8  EOSABS 0.0  BASOSABS 0.0    Chemistries   Recent Labs Lab 02/14/15 2030  NA 136  K  3.6  CL 100*  CO2 27  GLUCOSE 163*  BUN 24*  CREATININE 1.31*  CALCIUM 9.0  AST 26  ALT 14  ALKPHOS 56  BILITOT 0.7   ------------------------------------------------------------------------------------------------------------------ estimated creatinine clearance is 29.5 mL/min (by C-G formula based on Cr of 1.31). ------------------------------------------------------------------------------------------------------------------ No results for input(s): HGBA1C in the last 72 hours. ------------------------------------------------------------------------------------------------------------------ No results for input(s): CHOL, HDL, LDLCALC, TRIG, CHOLHDL, LDLDIRECT in the last 72 hours. ------------------------------------------------------------------------------------------------------------------ No results for input(s): TSH, T4TOTAL, T3FREE, THYROIDAB in the last 72 hours.  Invalid input(s): FREET3 ------------------------------------------------------------------------------------------------------------------ No results for input(s): VITAMINB12, FOLATE, FERRITIN, TIBC, IRON, RETICCTPCT in the last 72 hours.  Coagulation profile No results for input(s): INR, PROTIME in the last 168  hours.  No results for input(s): DDIMER in the last 72 hours.  Cardiac Enzymes  Recent Labs Lab 02/14/15 2031  TROPONINI 0.03   ------------------------------------------------------------------------------------------------------------------ Invalid input(s): POCBNP   CBG: No results for input(s): GLUCAP in the last 168 hours.     EKG: Independently reviewed.  Sinus rhythm with a left anterior fascicular block   Assessment/Plan Active Problems:  Abdominal pain:  Acute.  Patient not verbal, but physical exam note of tenderness especially of the right upper quadrant.  CT scan showing changes around the pancreas suggestive of pancreatitis and changes around the gallbladder. Patient found to have WBC of 15.4 , but lipase within normal limits.  -  Admit to telemetry bed -  Abdominal ultrasound stat for further evaluation -  Check blood cultures -  May warrant consult surgery or GI based off abdominal ultrasound -  Patient made NPO   Possible UTI:  Urinalysis with 6-30 WBCs, few bacteria, and small leukocytes.  -  F/u urine culture -  Rocephin  IV  Leukocytosis:  WBC 15.4 on admission. Question the possibility of underlying infection -  Continue to monitor  Anemia:  Hemoglobin 9.4  on admission  - continued to monitor  and workup possible causes  Vascular dementia  Code Status:   full Family Communication: bedside Disposition Plan: admit   Total time spent 55 minutes.Greater than 50% of this time was spent in counseling, explanation of diagnosis, planning of further management, and coordination of care  [facility] Pager [phone]  If 7PM-7AM, please contact night-coverage [email or web address] Password [facility] 02/15/2015, 2:49 AM

## 2015-02-16 DIAGNOSIS — R1013 Epigastric pain: Secondary | ICD-10-CM

## 2015-02-16 DIAGNOSIS — R109 Unspecified abdominal pain: Secondary | ICD-10-CM | POA: Diagnosis not present

## 2015-02-16 LAB — CBC
HCT: 23.9 % — ABNORMAL LOW (ref 36.0–46.0)
HEMATOCRIT: 23.4 % — AB (ref 36.0–46.0)
HEMOGLOBIN: 7.6 g/dL — AB (ref 12.0–15.0)
Hemoglobin: 7.5 g/dL — ABNORMAL LOW (ref 12.0–15.0)
MCH: 27 pg (ref 26.0–34.0)
MCH: 27.6 pg (ref 26.0–34.0)
MCHC: 31.8 g/dL (ref 30.0–36.0)
MCHC: 32.1 g/dL (ref 30.0–36.0)
MCV: 85.1 fL (ref 78.0–100.0)
MCV: 86 fL (ref 78.0–100.0)
PLATELETS: 239 10*3/uL (ref 150–400)
PLATELETS: 242 10*3/uL (ref 150–400)
RBC: 2.81 MIL/uL — AB (ref 3.87–5.11)
RBC: 2.72 MIL/uL — AB (ref 3.87–5.11)
RDW: 15.3 % (ref 11.5–15.5)
RDW: 15.3 % (ref 11.5–15.5)
WBC: 13.6 10*3/uL — ABNORMAL HIGH (ref 4.0–10.5)
WBC: 13.8 10*3/uL — AB (ref 4.0–10.5)

## 2015-02-16 LAB — BASIC METABOLIC PANEL
ANION GAP: 6 (ref 5–15)
BUN: 15 mg/dL (ref 6–20)
CALCIUM: 8.5 mg/dL — AB (ref 8.9–10.3)
CO2: 28 mmol/L (ref 22–32)
CREATININE: 1.26 mg/dL — AB (ref 0.44–1.00)
Chloride: 110 mmol/L (ref 101–111)
GFR, EST AFRICAN AMERICAN: 43 mL/min — AB (ref >60)
GFR, EST NON AFRICAN AMERICAN: 37 mL/min — AB (ref >60)
Glucose, Bld: 136 mg/dL — ABNORMAL HIGH (ref 65–99)
Potassium: 3.4 mmol/L — ABNORMAL LOW (ref 3.5–5.1)
SODIUM: 144 mmol/L (ref 135–145)

## 2015-02-16 LAB — URINE CULTURE: CULTURE: NO GROWTH

## 2015-02-16 MED ORDER — POTASSIUM CHLORIDE CRYS ER 20 MEQ PO TBCR
20.0000 meq | EXTENDED_RELEASE_TABLET | Freq: Once | ORAL | Status: AC
  Administered 2015-02-16: 20 meq via ORAL
  Filled 2015-02-16: qty 1

## 2015-02-16 MED ORDER — POTASSIUM CHLORIDE IN NACL 20-0.9 MEQ/L-% IV SOLN
INTRAVENOUS | Status: DC
  Administered 2015-02-17 (×2): via INTRAVENOUS
  Filled 2015-02-16: qty 1000

## 2015-02-16 NOTE — Progress Notes (Signed)
PROGRESS NOTE  Anne Compton [id number] DOB: [birth year] DOA: 02/14/2015 PCP: Reymundo Poll, MD   Brief History 82 year old female with medical history of hyperlipidemia, hypertension, stroke, dementia coming from a nursing home presents with abdominal pain of uncertain duration. Unfortunately, the patient is unable to provide any history secondary to her dementia and encephalopathy. This history is obtained from review of the medical record in speaking with the patient's son, [name]. At baseline, the patient is wheelchair bound, and is able to speak in full sentences although remains pleasantly confused. She is normally on a regular diet without any dysphagia restrictions. Apparently, the patient was noted to have abdominal pain on her right side and epigastric area by the nursing facility personnel. To the son's knowledge, she has not had any problems with fevers, chills, chest pain, shortness breath, vomiting, diarrhea, hematochezia, melena. Upon admission, the patient was noted to have serum creatinine 1.31 and a WBC 15.4. Initial CT of the abdomen and pelvis revealed inflammatory changes about the uncinate process of the pancreas as well as duodenal C-loop. The patient was started on intravenous antibiotics for possible UTI and intravenous fluids. Assessment/Plan: Acute encephalopathy -Suspect underlying infectious process as well as dehydration -significant improvement in past 24 hours -Blood cultures have been obtained, but these were obtained after the patient received ceftriaxone -urine culture--neg but with clinical improvement with pyuria-->finish 5 days abx -TSH -Serum B12 -Ammonia Abdominal pain with leukocytosis -01/20/17CT abdomen and pelvis inflammatory changes surrounding the uncinate process of the pancreas as well as duodenal C-loop; distended gallbladder with fat stranding -Abdominal ultrasound shows GB sludge, but negative for gallbladder wall thickening or  pericholecystic fluid -lipase and LFTs normal -consulted GI-hold plavix for possible endoscopy--feels abnormality may be PUD -Lactic acid 0.70 Pyuria -continue ceftriaxone -urine culture--neg but with clinical improvement with pyuria-->finish 5 days abx Hypertension -Continue labetalol -Hold losartan and Maxzide in the setting of dehydration and CKD History of stroke  -Hold Plavix for possible endoscopy Dementia with behavioral disturbance -Continue Aricept  -Continue Seroquel at bedtime  Hyperlipidemia  -Continue statin  Hypokalemia  -Replete  -Check magnesium    Family Communication: Son updated on phone 1/21 Disposition Plan: Home when medically stable    Procedures/Studies: US Abdomen Complete  02/15/2015  CLINICAL DATA:  82 year old female with abdominal pain EXAM: ABDOMEN ULTRASOUND COMPLETE COMPARISON:  CT dated 02/14/2015 FINDINGS: Gallbladder: There is a sludge within the gallbladder. No gallbladder wall thickening or pericholecystic fluid. Negative sonographic Murphy's sign. Common bile duct: Diameter: 6 mm Liver: Trace free fluid noted around the liver. The liver is otherwise unremarkable. IVC: No abnormality visualized. Pancreas: Not well visualized. Spleen: Size and appearance within normal limits. Right Kidney: Length: 10 cm the right kidney appears echogenic. No hydronephrosis. Left Kidney: Length: 8 cm. The left kidney also appears mildly echogenic. No hydronephrosis. Abdominal aorta: The aorta measures up to 2.5 cm on limited evaluation. Other findings: None. IMPRESSION: Gallbladder sludge without sonographic evidence of acute cholecystitis. No hydronephrosis. Electronically Signed   By: Anner Crete M.D.   On: 02/15/2015 04:57   Ct Abdomen Pelvis W Contrast  02/15/2015  ADDENDUM REPORT: 02/15/2015 04:59 ADDENDUM: There is diverticulosis of the colon with diverticula seen at the hepatic flexure. Small amount of inflammatory fluid surrounding the  diverticula may be extension from the inflammatory changes of the duodenum. Diverticulitis is less likely but not excluded. Clinical correlation is recommended. Electronically Signed   By: Laren Everts.D.  On: 02/15/2015 04:59  02/15/2015  CLINICAL DATA:  82 year old female with abdominal pain. EXAM: CT ABDOMEN AND PELVIS WITH CONTRAST TECHNIQUE: Multidetector CT imaging of the abdomen and pelvis was performed using the standard protocol following bolus administration of intravenous contrast. CONTRAST:  73mL OMNIPAQUE IOHEXOL 300 MG/ML  SOLN COMPARISON:  CT dated 02/08/2013 FINDINGS: Evaluation of the study is limited due to streak artifact caused by patient's arms. There is minimal bibasilar dependent atelectatic changes. Small left posterior diaphragmatic defect with herniation of small amount of abdominal fat compatible with a Bochdalek hernia. No intra-abdominal free air or free fluid. The gallbladder is distended. There is stranding in the fat surrounding the gallbladder and within the forehead status. No definite calcified stone identified within the gallbladder. There is an ill-defined 7 mm high attenuating focus (series 3, image 25 and coronal series 6, image 45) which may represent volume averaging artifact with adjacent vessel or stone within the neck of the gallbladder or in the cystic duct. Ultrasound is recommended for better evaluation of the gallbladder. There is inflammatory changes of the and uncinate process of the pancreas as well as inflammatory changes of the duodenal C-loop may represent pancreatitis versus duodenitis. Correlation with pancreatic enzymes recommended. No drainable fluid collection/abscess identified. The spleen, adrenal glands appear unremarkable. There is mild bilateral renal atrophy. There is no hydronephrosis on either side. The visualized ureters appear unremarkable. The urinary bladder is collapsed. There is diffuse thickening of the bladder wall. Cystitis or an  infiltrative process is not excluded. Correlation with urinalysis and urine cytology and further evaluation with cystoscopy if clinically indicated recommended. There is calcification of the uterus compatible with fibroids. There is extensive sigmoid diverticulosis without active inflammatory changes. There is no evidence of bowel obstruction. A moderate size hiatal hernia is seen. There is inflammatory changes of the distal stomach and duodenal C-loop as described may represent gastroenteritis or be related to pancreatitis. A 2.0 x 1.7 cm nodular soft tissue density in the region of the duodenal bulb may represent oral content within the lumen. The soft tissue lesion is not excluded. Upper GI study or endoscopy is recommended for better evaluation. There is aortoiliac atherosclerotic disease. The abdominal aorta and IVC appear patent. No portal venous gas identified. There is no adenopathy. There is a small fat containing umbilical hernia. The abdominal wall soft tissues are grossly unremarkable. There is degenerative changes of the spine. No acute fracture. IMPRESSION: Inflammatory changes of the porta hepaticus, surrounding the head and uncinate process of the pancreas and duodenal C-loop. Findings may represent pancreatitis or gastroduodenitis. Clinical correlation is recommended. Ill-defined high attenuating area along the course of the cystic duct may be volume averaging artifact with the adjacent vessels or represent stone within the cystic duct. Ultrasound is recommended for better evaluation of the gallbladder. Soft tissue appearing structure within the region of the duodenal bulb may represent oral content, or luminal fold. The soft tissue lesion is not excluded. Endoscopy or upper GI study may provide better evaluation. Diffuse thickening of the bladder wall may represent cystitis or an infiltrative process. Correlation with urinalysis and further evaluation with endoscopy if clinically indicated  recommended. Sigmoid diverticulosis.  No evidence of bowel obstruction. Electronically Signed: By: Anner Crete M.D. On: 02/14/2015 22:01   Dg Chest Port 1 View  02/14/2015  CLINICAL DATA:  Right lower quadrant pain, dementia EXAM: PORTABLE CHEST 1 VIEW COMPARISON:  11/03/2014 FINDINGS: Cardiomediastinal silhouette is stable. No acute infiltrate or pleural effusion. No pulmonary edema. Elevation  of the right hemidiaphragm again noted. IMPRESSION: No active disease. Elevation of the right hemidiaphragm again noted. Electronically Signed   By: Lahoma Crocker M.D.   On: 02/14/2015 23:21         Subjective: Patient is more awake and alert but pleasantly confused. Denies any headache, chest pain, shortness breath, abdominal pain. No reports of vomiting, dysuria, uncontrolled pain, respiratory distress.  Objective: Filed Vitals:   02/15/15 1500 02/15/15 2141 02/16/15 0609 02/16/15 1313  BP: 148/73 144/73 147/76 136/81  Pulse: 89 84 84 70  Temp: 100.4 F (38 C) 98.8 F (37.1 C) 98.9 F (37.2 C) 98.5 F (36.9 C)  TempSrc: Axillary Axillary Axillary Oral  Resp: 20 19 18 16   Height:      Weight:   74.662 kg (164 lb 9.6 oz)   SpO2: 95% 96% 97% 97%    Intake/Output Summary (Last 24 hours) at 02/16/15 1747 Last data filed at 02/16/15 1401  Gross per 24 hour  Intake   4450 ml  Output      0 ml  Net   4450 ml   Weight change: 1.862 kg (4 lb 1.7 oz) Exam:   General:  Pt is alert, follows commands appropriately, not in acute distress  HEENT: No icterus, No thrush, No neck mass, [place]/AT  Cardiovascular: RRR, S1/S2, no rubs, no gallops  Respiratory: CTA bilaterally, no wheezing, no crackles, no rhonchi  Abdomen: Soft/+BS, epigastric tenderness without rebound, non distended, no guarding; no hepatosplenomegaly  Extremities: No edema, No lymphangitis, No petechiae, No rashes, no synovitis; no cyanosis or clubbing  Data Reviewed: Basic Metabolic Panel:  Recent Labs Lab  02/14/15 2030 02/15/15 0552 02/16/15 0522  NA 136 139 144  K 3.6 3.2* 3.4*  CL 100* 101 110  CO2 27 29 28   GLUCOSE 163* 154* 136*  BUN 24* 20 15  CREATININE 1.31* 1.28* 1.26*  CALCIUM 9.0 9.0 8.5*   Liver Function Tests:  Recent Labs Lab 02/14/15 2030  AST 26  ALT 14  ALKPHOS 56  BILITOT 0.7  PROT 7.2  ALBUMIN 3.5    Recent Labs Lab 02/14/15 2030  LIPASE 17   No results for input(s): AMMONIA in the last 168 hours. CBC:  Recent Labs Lab 02/14/15 2030 02/15/15 0552 02/16/15 0522 02/16/15 1430  WBC 15.4* 15.7* 13.6* 13.8*  NEUTROABS 13.8*  --   --   --   HGB 9.4* 8.3* 7.6* 7.5*  HCT 30.5* 25.8* 23.9* 23.4*  MCV 85.7 83.5 85.1 86.0  PLT 307 271 239 242   Cardiac Enzymes:  Recent Labs Lab 02/14/15 2031  TROPONINI 0.03   BNP: Invalid input(s): POCBNP CBG: No results for input(s): GLUCAP in the last 168 hours.  Recent Results (from the past 240 hour(s))  Urine culture     Status: None   Collection Time: 02/14/15  8:20 PM  Result Value Ref Range Status   Specimen Description URINE, CATHETERIZED  Final   Special Requests NONE  Final   Culture   Final    NO GROWTH 2 DAYS Performed at [facility]    Report Status 02/16/2015 FINAL  Final  MRSA PCR Screening     Status: Abnormal   Collection Time: 02/15/15  2:16 AM  Result Value Ref Range Status   MRSA by PCR POSITIVE (A) NEGATIVE Final    Comment:        The GeneXpert MRSA Assay (FDA approved for NASAL specimens only), is one component of a comprehensive MRSA colonization  surveillance program. It is not intended to diagnose MRSA infection nor to guide or monitor treatment for MRSA infections. RESULT CALLED TO, READ BACK BY AND VERIFIED WITH: C DULIAO,RN @0518  02/15/15 MKELLY   Culture, blood (routine x 2)     Status: None (Preliminary result)   Collection Time: 02/15/15 10:30 AM  Result Value Ref Range Status   Specimen Description BLOOD LEFT ANTECUBITAL  Final   Special Requests  BOTTLES DRAWN AEROBIC ONLY [place]  Final   Culture NO GROWTH 1 DAY  Final   Report Status PENDING  Incomplete  Culture, blood (routine x 2)     Status: None (Preliminary result)   Collection Time: 02/15/15 10:35 AM  Result Value Ref Range Status   Specimen Description BLOOD BLOOD LEFT HAND  Final   Special Requests IN PEDIATRIC BOTTLE 3CC  Final   Culture NO GROWTH 1 DAY  Final   Report Status PENDING  Incomplete     Scheduled Meds: . acetaminophen  650 mg Oral BID  . atorvastatin  10 mg Oral Daily  . cefTRIAXone (ROCEPHIN)  IV  2 g Intravenous Q24H  . Chlorhexidine Gluconate Cloth  6 each Topical Q0600  . cholecalciferol  1,000 Units Oral q morning - 10a  . citalopram  20 mg Oral Daily  . donepezil  10 mg Oral QHS  . famotidine  10 mg Oral Daily  . feeding supplement (ENSURE ENLIVE)  237 mL Oral Q1500  . ferrous sulfate  325 mg Oral Q breakfast  . fluticasone  2 spray Each Nare Daily  . heparin  5,000 Units Subcutaneous 3 times per day  . labetalol  100 mg Oral TID  . multivitamin with minerals  1 tablet Oral Daily  . mupirocin ointment  1 application Nasal BID  . pantoprazole (PROTONIX) IV  40 mg Intravenous Q12H  . QUEtiapine  12.5 mg Oral Daily  . QUEtiapine  50 mg Oral QHS  . sodium chloride  3 mL Intravenous Q12H  . traMADol  50 mg Oral BID   Continuous Infusions: . sodium chloride 0.9 % 1,000 mL with potassium chloride 20 mEq infusion 75 mL/hr at 02/15/15 1317     Sarahy Creedon, DO  [facility] Pager [phone]  If 7PM-7AM, please contact night-coverage [email or web address] Password [facility] 02/16/2015, 5:47 PM   LOS: 1 day

## 2015-02-16 NOTE — Progress Notes (Signed)
     [facility] Progress Note  Subjective:  Hgb this morning 7.6.  Much more alert today, on phone speaking to family. Denies abd pain. Says she feels a little better today.WBC 13.6.Urine culture NG. Blood cultures pending. Low grade temp.   Objective:  Vital signs in last 24 hours: Temp:  [98.8 F (37.1 C)-100.4 F (38 C)] 98.9 F (37.2 C) (01/22 0609) Pulse Rate:  [84-89] 84 (01/22 0609) Resp:  [18-20] 18 (01/22 0609) BP: (144-148)/(73-76) 147/76 mmHg (01/22 0609) SpO2:  [95 %-97 %] 97 % (01/22 0609) Weight:  [164 lb 9.6 oz (74.662 kg)] 164 lb 9.6 oz (74.662 kg) (01/22 0609) Last BM Date: 02/15/15 General:   Alert,pleasantly confused,  in NAD Heart:  Regular rate and rhythm; no murmurs Pulm;diminished breath sounds (poor effort) Abdomen:  Soft, mild epigastric TTP, nondistended. Normal bowel sounds, without guarding, and without rebound.   Extremities:  Trace LE edema. Neurologic: Alert , pleasantly confused    Lab Results:  Recent Labs  02/14/15 2030 02/15/15 0552 02/16/15 0522  WBC 15.4* 15.7* 13.6*  HGB 9.4* 8.3* 7.6*  HCT 30.5* 25.8* 23.9*  PLT 307 271 239   BMET  Recent Labs  02/14/15 2030 02/15/15 0552 02/16/15 0522  NA 136 139 144  K 3.6 3.2* 3.4*  CL 100* 101 110  CO2 27 29 28   GLUCOSE 163* 154* 136*  BUN 24* 20 15  CREATININE 1.31* 1.28* 1.26*  CALCIUM 9.0 9.0 8.5*     ASSESSMENT/PLAN:  82 year old female with a history of vascular dementia TIA on Plavix, presenting from [place] [facility] facility with complaints of abdominal pain. CT with soft tissue appearing structure within the region of the duodenal bulb, soft tissue lesion not excluded. Also noted were inflammatory changes of the porta hepaticus, surrounding the head and uncinate process of the pancreas and duodenal C-loop. Findings may represent pancreatitis or gastroduodenitis. LFTs and lipase normal.At this point most likely dx is a duodenal ulcer.Pt still getting  plavix--have talked to Dr Tat and will d/c plavix today.  Continue PPI. Check stool for occult blood. Trend Hgb.GI to re-eval tomorrow and determine timing of possible EGD.     LOS: 1 day   Hvozdovic, Vita Barley PA-C 02/16/2015, Pager [phone] Mon-Fri 8a-5p [phone] after 5p, weekends, holidays  Agree w/ Ms. Hvozdovic's note and mangement. CT findings and other clinical info suggest duodenal ulcer most likely - neoplasm in differential Hgb drifting down - plavix to be held She is improving  Dr. Collene Mares or Benson Norway to see and determine next steps  Gatha Mayer, MD, [facility]

## 2015-02-17 ENCOUNTER — Inpatient Hospital Stay (HOSPITAL_COMMUNITY): Payer: Medicare Other

## 2015-02-17 LAB — CBC
HEMATOCRIT: 22.5 % — AB (ref 36.0–46.0)
HEMOGLOBIN: 7 g/dL — AB (ref 12.0–15.0)
MCH: 26.6 pg (ref 26.0–34.0)
MCHC: 31.1 g/dL (ref 30.0–36.0)
MCV: 85.6 fL (ref 78.0–100.0)
Platelets: 231 10*3/uL (ref 150–400)
RBC: 2.63 MIL/uL — ABNORMAL LOW (ref 3.87–5.11)
RDW: 15.3 % (ref 11.5–15.5)
WBC: 12.4 10*3/uL — AB (ref 4.0–10.5)

## 2015-02-17 LAB — BASIC METABOLIC PANEL
ANION GAP: 8 (ref 5–15)
BUN: 14 mg/dL (ref 6–20)
CALCIUM: 8.1 mg/dL — AB (ref 8.9–10.3)
CHLORIDE: 112 mmol/L — AB (ref 101–111)
CO2: 25 mmol/L (ref 22–32)
Creatinine, Ser: 1.08 mg/dL — ABNORMAL HIGH (ref 0.44–1.00)
GFR calc non Af Amer: 45 mL/min — ABNORMAL LOW (ref >60)
GFR, EST AFRICAN AMERICAN: 52 mL/min — AB (ref >60)
Glucose, Bld: 122 mg/dL — ABNORMAL HIGH (ref 65–99)
Potassium: 3.7 mmol/L (ref 3.5–5.1)
SODIUM: 145 mmol/L (ref 135–145)

## 2015-02-17 LAB — VITAMIN B12: Vitamin B-12: 322 pg/mL (ref 180–914)

## 2015-02-17 LAB — MAGNESIUM: MAGNESIUM: 2 mg/dL (ref 1.7–2.4)

## 2015-02-17 LAB — AMMONIA: Ammonia: 21 umol/L (ref 9–35)

## 2015-02-17 LAB — ABO/RH: ABO/RH(D): O POS

## 2015-02-17 LAB — PREPARE RBC (CROSSMATCH)

## 2015-02-17 LAB — TSH: TSH: 1.208 u[IU]/mL (ref 0.350–4.500)

## 2015-02-17 MED ORDER — CYANOCOBALAMIN 250 MCG PO TABS
250.0000 ug | ORAL_TABLET | Freq: Every day | ORAL | Status: DC
  Administered 2015-02-17 – 2015-02-20 (×3): 250 ug via ORAL
  Filled 2015-02-17 (×5): qty 1

## 2015-02-17 MED ORDER — HYDRALAZINE HCL 20 MG/ML IJ SOLN
10.0000 mg | Freq: Four times a day (QID) | INTRAMUSCULAR | Status: DC | PRN
  Administered 2015-02-17 – 2015-02-18 (×2): 10 mg via INTRAVENOUS
  Filled 2015-02-17 (×2): qty 1

## 2015-02-17 MED ORDER — SODIUM CHLORIDE 0.9 % IV SOLN
Freq: Once | INTRAVENOUS | Status: AC
  Administered 2015-02-17: 15:00:00 via INTRAVENOUS

## 2015-02-17 MED ORDER — FUROSEMIDE 10 MG/ML IJ SOLN
20.0000 mg | Freq: Once | INTRAMUSCULAR | Status: AC
  Administered 2015-02-17: 20 mg via INTRAVENOUS
  Filled 2015-02-17: qty 2

## 2015-02-17 NOTE — Progress Notes (Signed)
Phonecall was placed to Anne Compton to get consent for blood. He consented and said that Anne Compton could get blood. CJ Marcello Moores was the second nurse to verify consent.

## 2015-02-17 NOTE — Progress Notes (Addendum)
PROGRESS NOTE  Anne Compton [id number] DOB: Apr 22, 1927 DOA: 02/14/2015 PCP: Reymundo Poll, MD  Brief History 82 year old female with medical history of hyperlipidemia, hypertension, stroke, dementia coming from a nursing home presents with abdominal pain of uncertain duration. Unfortunately, the patient is unable to provide any history secondary to her dementia and encephalopathy. This history is obtained from review of the medical record in speaking with the patient's son, [name]. At baseline, the patient is wheelchair bound, and is able to speak in full sentences although remains pleasantly confused. She is normally on a regular diet without any dysphagia restrictions. Apparently, the patient was noted to have abdominal pain on her right side and epigastric area by the nursing facility personnel. To the son's knowledge, she has not had any problems with fevers, chills, chest pain, shortness breath, vomiting, diarrhea, hematochezia, melena. Upon admission, the patient was noted to have serum creatinine 1.31 and a WBC 15.4. Initial CT of the abdomen and pelvis revealed inflammatory changes about the uncinate process of the pancreas as well as duodenal C-loop. The patient was started on intravenous antibiotics for possible UTI and intravenous fluids. Assessment/Plan: Acute encephalopathy -Suspect underlying infectious process as well as dehydration -significant improvement in past 24-48 hours--at baseline 1/23 -Blood cultures have been obtained, but these were obtained after the patient received ceftriaxone -urine culture--neg but with clinical improvement with pyuria-->finish 5 days abx -TSH--1.208 -Serum B12--322--> since this is on the low normal range, will supplement -Ammonia--21 Abdominal pain with leukocytosis -01/20/17CT abdomen and pelvis inflammatory changes surrounding the uncinate process of the pancreas as well as duodenal C-loop; distended gallbladder with fat  stranding -Abdominal ultrasound shows GB sludge, but negative for gallbladder wall thickening or pericholecystic fluid -lipase and LFTs normal -consulted GI-hold plavix for possible endoscopy--feels abnormality may be PUD -Lactic acid 0.70 Pyuria -continue ceftriaxone -urine culture--neg but with clinical improvement with pyuria-->finish 5 days abx Normocytic anemia -Baseline hemoglobin 9 -Drop in hemoglobin likely dilution -Transfuse 1 unit PRBC Dyspnea -Saline lock IV fluid -Chest x-ray -EKG sinus rhythm with nonspecific ST changes -No anginal symptoms Hypertension -Continue labetalol -Hold losartan and Maxzide in the setting of dehydration and CKD History of stroke  -Hold Plavix for possible endoscopy Dementia with behavioral disturbance -Continue Aricept  -Continue Seroquel at bedtime  Hyperlipidemia  -Continue statin  Hypokalemia  -Replete  -Check magnesium--2.0    Family Communication: Son updated on phone 1/21 Disposition Plan:SNF on 1/24 if cleared by GI   Procedures/Studies: US Abdomen Complete  02/15/2015  CLINICAL DATA:  82 year old female with abdominal pain EXAM: ABDOMEN ULTRASOUND COMPLETE COMPARISON:  CT dated 02/14/2015 FINDINGS: Gallbladder: There is a sludge within the gallbladder. No gallbladder wall thickening or pericholecystic fluid. Negative sonographic Murphy's sign. Common bile duct: Diameter: 6 mm Liver: Trace free fluid noted around the liver. The liver is otherwise unremarkable. IVC: No abnormality visualized. Pancreas: Not well visualized. Spleen: Size and appearance within normal limits. Right Kidney: Length: 10 cm the right kidney appears echogenic. No hydronephrosis. Left Kidney: Length: 8 cm. The left kidney also appears mildly echogenic. No hydronephrosis. Abdominal aorta: The aorta measures up to 2.5 cm on limited evaluation. Other findings: None. IMPRESSION: Gallbladder sludge without sonographic evidence of acute cholecystitis. No  hydronephrosis. Electronically Signed   By: Anner Crete M.D.   On: 02/15/2015 04:57   Ct Abdomen Pelvis W Contrast  02/15/2015  ADDENDUM REPORT: 02/15/2015 04:59 ADDENDUM: There is diverticulosis of the colon with diverticula seen  at the hepatic flexure. Small amount of inflammatory fluid surrounding the diverticula may be extension from the inflammatory changes of the duodenum. Diverticulitis is less likely but not excluded. Clinical correlation is recommended. Electronically Signed   By: Anner Crete M.D.   On: 02/15/2015 04:59  02/15/2015  CLINICAL DATA:  82 year old female with abdominal pain. EXAM: CT ABDOMEN AND PELVIS WITH CONTRAST TECHNIQUE: Multidetector CT imaging of the abdomen and pelvis was performed using the standard protocol following bolus administration of intravenous contrast. CONTRAST:  51mL OMNIPAQUE IOHEXOL 300 MG/ML  SOLN COMPARISON:  CT dated 02/08/2013 FINDINGS: Evaluation of the study is limited due to streak artifact caused by patient's arms. There is minimal bibasilar dependent atelectatic changes. Small left posterior diaphragmatic defect with herniation of small amount of abdominal fat compatible with a Bochdalek hernia. No intra-abdominal free air or free fluid. The gallbladder is distended. There is stranding in the fat surrounding the gallbladder and within the forehead status. No definite calcified stone identified within the gallbladder. There is an ill-defined 7 mm high attenuating focus (series 3, image 25 and coronal series 6, image 45) which may represent volume averaging artifact with adjacent vessel or stone within the neck of the gallbladder or in the cystic duct. Ultrasound is recommended for better evaluation of the gallbladder. There is inflammatory changes of the and uncinate process of the pancreas as well as inflammatory changes of the duodenal C-loop may represent pancreatitis versus duodenitis. Correlation with pancreatic enzymes recommended. No  drainable fluid collection/abscess identified. The spleen, adrenal glands appear unremarkable. There is mild bilateral renal atrophy. There is no hydronephrosis on either side. The visualized ureters appear unremarkable. The urinary bladder is collapsed. There is diffuse thickening of the bladder wall. Cystitis or an infiltrative process is not excluded. Correlation with urinalysis and urine cytology and further evaluation with cystoscopy if clinically indicated recommended. There is calcification of the uterus compatible with fibroids. There is extensive sigmoid diverticulosis without active inflammatory changes. There is no evidence of bowel obstruction. A moderate size hiatal hernia is seen. There is inflammatory changes of the distal stomach and duodenal C-loop as described may represent gastroenteritis or be related to pancreatitis. A 2.0 x 1.7 cm nodular soft tissue density in the region of the duodenal bulb may represent oral content within the lumen. The soft tissue lesion is not excluded. Upper GI study or endoscopy is recommended for better evaluation. There is aortoiliac atherosclerotic disease. The abdominal aorta and IVC appear patent. No portal venous gas identified. There is no adenopathy. There is a small fat containing umbilical hernia. The abdominal wall soft tissues are grossly unremarkable. There is degenerative changes of the spine. No acute fracture. IMPRESSION: Inflammatory changes of the porta hepaticus, surrounding the head and uncinate process of the pancreas and duodenal C-loop. Findings may represent pancreatitis or gastroduodenitis. Clinical correlation is recommended. Ill-defined high attenuating area along the course of the cystic duct may be volume averaging artifact with the adjacent vessels or represent stone within the cystic duct. Ultrasound is recommended for better evaluation of the gallbladder. Soft tissue appearing structure within the region of the duodenal bulb may represent  oral content, or luminal fold. The soft tissue lesion is not excluded. Endoscopy or upper GI study may provide better evaluation. Diffuse thickening of the bladder wall may represent cystitis or an infiltrative process. Correlation with urinalysis and further evaluation with endoscopy if clinically indicated recommended. Sigmoid diverticulosis.  No evidence of bowel obstruction. Electronically Signed: By: Laren EvertsD.  On: 02/14/2015 22:01   Dg Chest Port 1 View  02/14/2015  CLINICAL DATA:  Right lower quadrant pain, dementia EXAM: PORTABLE CHEST 1 VIEW COMPARISON:  11/03/2014 FINDINGS: Cardiomediastinal silhouette is stable. No acute infiltrate or pleural effusion. No pulmonary edema. Elevation of the right hemidiaphragm again noted. IMPRESSION: No active disease. Elevation of the right hemidiaphragm again noted. Electronically Signed   By: Lahoma Crocker M.D.   On: 02/14/2015 23:21         Subjective: Patient denies fevers, chills, headache, chest pain, dyspnea, nausea, vomiting, diarrhea, abdominal pain, dysuria, hematuria   Objective: Filed Vitals:   02/16/15 2210 02/17/15 0538 02/17/15 1154 02/17/15 1340  BP: 131/67 184/84 161/81 147/81  Pulse: 83 79 75 83  Temp: 99.5 F (37.5 C) 98.8 F (37.1 C)  98.7 F (37.1 C)  TempSrc: Oral Oral  Oral  Resp: 16 16  17   Height:      Weight:  74.9 kg (165 lb 2 oz)    SpO2: 96% 96%  96%    Intake/Output Summary (Last 24 hours) at 02/17/15 1428 Last data filed at 02/17/15 0600  Gross per 24 hour  Intake 1121.25 ml  Output      0 ml  Net 1121.25 ml   Weight change: 0.238 kg (8.4 oz) Exam:   General:  Pt is alert, follows commands appropriately, not in acute distress  HEENT: No icterus, No thrush, No neck mass, [place]/AT  Cardiovascular: RRR, S1/S2, no rubs, no gallops  Respiratory: Bilateral crackles. No wheeze   Abdomen: Soft/+BS, epigastric tender without rebound, non distended, no guarding  Extremities: No edema, No  lymphangitis, No petechiae, No rashes, no synovitis; no cyanosis or clubbing  Data Reviewed: Basic Metabolic Panel:  Recent Labs Lab 02/14/15 2030 02/15/15 0552 02/16/15 0522 02/17/15 0413  NA 136 139 144 145  K 3.6 3.2* 3.4* 3.7  CL 100* 101 110 112*  CO2 27 29 28 25   GLUCOSE 163* 154* 136* 122*  BUN 24* 20 15 14   CREATININE 1.31* 1.28* 1.26* 1.08*  CALCIUM 9.0 9.0 8.5* 8.1*  MG  --   --   --  2.0   Liver Function Tests:  Recent Labs Lab 02/14/15 2030  AST 26  ALT 14  ALKPHOS 56  BILITOT 0.7  PROT 7.2  ALBUMIN 3.5    Recent Labs Lab 02/14/15 2030  LIPASE 17    Recent Labs Lab 02/17/15 0412  AMMONIA 21   CBC:  Recent Labs Lab 02/14/15 2030 02/15/15 0552 02/16/15 0522 02/16/15 1430 02/17/15 0413  WBC 15.4* 15.7* 13.6* 13.8* 12.4*  NEUTROABS 13.8*  --   --   --   --   HGB 9.4* 8.3* 7.6* 7.5* 7.0*  HCT 30.5* 25.8* 23.9* 23.4* 22.5*  MCV 85.7 83.5 85.1 86.0 85.6  PLT 307 271 239 242 231   Cardiac Enzymes:  Recent Labs Lab 02/14/15 2031  TROPONINI 0.03   BNP: Invalid input(s): POCBNP CBG: No results for input(s): GLUCAP in the last 168 hours.  Recent Results (from the past 240 hour(s))  Urine culture     Status: None   Collection Time: 02/14/15  8:20 PM  Result Value Ref Range Status   Specimen Description URINE, CATHETERIZED  Final   Special Requests NONE  Final   Culture   Final    NO GROWTH 2 DAYS Performed at [facility]    Report Status 02/16/2015 FINAL  Final  MRSA PCR Screening     Status: Abnormal  Collection Time: 02/15/15  2:16 AM  Result Value Ref Range Status   MRSA by PCR POSITIVE (A) NEGATIVE Final    Comment:        The GeneXpert MRSA Assay (FDA approved for NASAL specimens only), is one component of a comprehensive MRSA colonization surveillance program. It is not intended to diagnose MRSA infection nor to guide or monitor treatment for MRSA infections. RESULT CALLED TO, READ BACK BY AND VERIFIED  WITH: C DULIAO,RN @0518  02/15/15 MKELLY   Culture, blood (routine x 2)     Status: None (Preliminary result)   Collection Time: 02/15/15 10:30 AM  Result Value Ref Range Status   Specimen Description BLOOD LEFT ANTECUBITAL  Final   Special Requests BOTTLES DRAWN AEROBIC ONLY [place]  Final   Culture NO GROWTH 1 DAY  Final   Report Status PENDING  Incomplete  Culture, blood (routine x 2)     Status: None (Preliminary result)   Collection Time: 02/15/15 10:35 AM  Result Value Ref Range Status   Specimen Description BLOOD BLOOD LEFT HAND  Final   Special Requests IN PEDIATRIC BOTTLE 3CC  Final   Culture NO GROWTH 1 DAY  Final   Report Status PENDING  Incomplete     Scheduled Meds: . sodium chloride   Intravenous Once  . acetaminophen  650 mg Oral BID  . atorvastatin  10 mg Oral Daily  . cefTRIAXone (ROCEPHIN)  IV  2 g Intravenous Q24H  . Chlorhexidine Gluconate Cloth  6 each Topical Q0600  . cholecalciferol  1,000 Units Oral q morning - 10a  . citalopram  20 mg Oral Daily  . donepezil  10 mg Oral QHS  . famotidine  10 mg Oral Daily  . feeding supplement (ENSURE ENLIVE)  237 mL Oral Q1500  . ferrous sulfate  325 mg Oral Q breakfast  . fluticasone  2 spray Each Nare Daily  . heparin  5,000 Units Subcutaneous 3 times per day  . labetalol  100 mg Oral TID  . multivitamin with minerals  1 tablet Oral Daily  . mupirocin ointment  1 application Nasal BID  . pantoprazole (PROTONIX) IV  40 mg Intravenous Q12H  . QUEtiapine  12.5 mg Oral Daily  . QUEtiapine  50 mg Oral QHS  . sodium chloride  3 mL Intravenous Q12H  . traMADol  50 mg Oral BID   Continuous Infusions:    Joan Herschberger, DO  [facility] Pager [phone]  If 7PM-7AM, please contact night-coverage [email or web address] Password [email or web address] 02/17/2015, 2:28 PM   LOS: 2 days

## 2015-02-17 NOTE — Progress Notes (Signed)
Pt. Showing signs of wheezing. Respiratory called for breathing treatment. Vital Signs are stable. O2 sats 98% on room air.

## 2015-02-17 NOTE — Progress Notes (Signed)
UNASSIGNED PATIENT Subjective: Patient unable to give any history; lethargic and confused. As per the my discussion with the patient's son [name] he is the power of attorney for the patient. Patient's hemoglobin is low at 7 g/dL today. Patient is unable to voice any complaints at this time due to her dementia and confusion  Objective: Vital signs in last 24 hours: Temp:  [98.7 F (37.1 C)-99.5 F (37.5 C)] 98.7 F (37.1 C) (01/23 1340) Pulse Rate:  [75-83] 83 (01/23 1340) Resp:  [16-17] 17 (01/23 1340) BP: (131-184)/(67-84) 147/81 mmHg (01/23 1340) SpO2:  [96 %] 96 % (01/23 1340) Weight:  [74.9 kg (165 lb 2 oz)] 74.9 kg (165 lb 2 oz) (01/23 0538) Last BM Date: 02/16/15  Intake/Output from previous day: 01/22 0701 - 01/23 0700 In: 5211.3 [I.V.:4861.3; IV Piggyback:350] Out: -  Intake/Output this shift:    General appearance: appears stated age, fatigued, moderate distress, pale, confused with slowed mentation Resp: diminished breath sounds bibasilar Cardio: regular rate and rhythm, S1, S2 normal, no murmur, click, rub or gallop GI: soft, non-tender; bowel sounds normal; no masses,  no organomegaly  Lab Results:  Recent Labs  02/16/15 0522 02/16/15 1430 02/17/15 0413  WBC 13.6* 13.8* 12.4*  HGB 7.6* 7.5* 7.0*  HCT 23.9* 23.4* 22.5*  PLT 239 242 231   BMET  Recent Labs  02/15/15 0552 02/16/15 0522 02/17/15 0413  NA 139 144 145  K 3.2* 3.4* 3.7  CL 101 110 112*  CO2 29 28 25   GLUCOSE 154* 136* 122*  BUN 20 15 14   CREATININE 1.28* 1.26* 1.08*  CALCIUM 9.0 8.5* 8.1*   LFT  Recent Labs  02/14/15 2030  PROT 7.2  ALBUMIN 3.5  AST 26  ALT 14  ALKPHOS 56  BILITOT 0.7   Studies/Results: Dg Chest Port 1 View  02/17/2015  CLINICAL DATA:  Dyspnea EXAM: PORTABLE CHEST 1 VIEW COMPARISON:  Radiograph 02/14/2015 FINDINGS: Normal cardiac silhouette. Low lung volumes. Mild central venous congestion. No focal consolidation. Small LEFT effusion. No pneumothorax.  IMPRESSION: Low lung volumes with central venous congestion. Small LEFT effusion. Electronically Signed   By: Suzy Bouchard M.D.   On: 02/17/2015 15:46    Medications: I have reviewed the patient's current medications.  Assessment/Plan: Anemia with abnormal CT scan rule out PUD: will proceed with an EGD tomorrow. Her son has agreed to give [place] permission to do the procedure.  LOS: 2 days   Richa Shor 02/17/2015, 5:55 PM

## 2015-02-18 ENCOUNTER — Encounter (HOSPITAL_COMMUNITY)
Admission: EM | Disposition: A | Discharge status: Skilled Nursing Facility | Payer: Medicare PPO | Source: Home / Self Care | Attending: Internal Medicine

## 2015-02-18 ENCOUNTER — Encounter (HOSPITAL_COMMUNITY): Payer: Self-pay | Admitting: *Deleted

## 2015-02-18 HISTORY — PX: ESOPHAGOGASTRODUODENOSCOPY: SHX5428

## 2015-02-18 LAB — BASIC METABOLIC PANEL
ANION GAP: 11 (ref 5–15)
BUN: 13 mg/dL (ref 6–20)
CALCIUM: 8.6 mg/dL — AB (ref 8.9–10.3)
CO2: 27 mmol/L (ref 22–32)
CREATININE: 0.98 mg/dL (ref 0.44–1.00)
Chloride: 109 mmol/L (ref 101–111)
GFR, EST AFRICAN AMERICAN: 59 mL/min — AB (ref >60)
GFR, EST NON AFRICAN AMERICAN: 51 mL/min — AB (ref >60)
Glucose, Bld: 136 mg/dL — ABNORMAL HIGH (ref 65–99)
Potassium: 3.4 mmol/L — ABNORMAL LOW (ref 3.5–5.1)
SODIUM: 147 mmol/L — AB (ref 135–145)

## 2015-02-18 LAB — TYPE AND SCREEN
ABO/RH(D): O POS
ANTIBODY SCREEN: NEGATIVE
Unit division: 0

## 2015-02-18 LAB — CBC
HCT: 28.3 % — ABNORMAL LOW (ref 36.0–46.0)
HEMOGLOBIN: 8.8 g/dL — AB (ref 12.0–15.0)
MCH: 26.6 pg (ref 26.0–34.0)
MCHC: 31.1 g/dL (ref 30.0–36.0)
MCV: 85.5 fL (ref 78.0–100.0)
PLATELETS: 265 10*3/uL (ref 150–400)
RBC: 3.31 MIL/uL — AB (ref 3.87–5.11)
RDW: 14.8 % (ref 11.5–15.5)
WBC: 11.8 10*3/uL — ABNORMAL HIGH (ref 4.0–10.5)

## 2015-02-18 SURGERY — EGD (ESOPHAGOGASTRODUODENOSCOPY)
Anesthesia: Moderate Sedation | Laterality: Left

## 2015-02-18 MED ORDER — FENTANYL CITRATE (PF) 100 MCG/2ML IJ SOLN
INTRAMUSCULAR | Status: DC | PRN
  Administered 2015-02-18 (×2): 12.5 ug via INTRAVENOUS

## 2015-02-18 MED ORDER — DEXTROSE 5 % IV SOLN
2.0000 g | INTRAVENOUS | Status: DC
  Administered 2015-02-19 – 2015-02-20 (×2): 2 g via INTRAVENOUS
  Filled 2015-02-18 (×3): qty 2

## 2015-02-18 MED ORDER — FENTANYL CITRATE (PF) 100 MCG/2ML IJ SOLN
INTRAMUSCULAR | Status: AC
  Filled 2015-02-18: qty 2

## 2015-02-18 MED ORDER — FUROSEMIDE 10 MG/ML IJ SOLN
20.0000 mg | Freq: Once | INTRAMUSCULAR | Status: AC
  Administered 2015-02-18: 20 mg via INTRAVENOUS
  Filled 2015-02-18: qty 2

## 2015-02-18 MED ORDER — POTASSIUM CHLORIDE CRYS ER 20 MEQ PO TBCR
40.0000 meq | EXTENDED_RELEASE_TABLET | Freq: Once | ORAL | Status: AC
  Administered 2015-02-18: 40 meq via ORAL
  Filled 2015-02-18: qty 2

## 2015-02-18 MED ORDER — LORAZEPAM 0.5 MG PO TABS: 0.5000 mg | ORAL_TABLET | Freq: Two times a day (BID) | ORAL | Status: DC | PRN

## 2015-02-18 MED ORDER — QUETIAPINE FUMARATE 50 MG PO TABS
50.0000 mg | ORAL_TABLET | Freq: Every day | ORAL | Status: DC
  Administered 2015-02-18 – 2015-02-19 (×2): 50 mg via ORAL
  Filled 2015-02-18 (×2): qty 1

## 2015-02-18 MED ORDER — MIDAZOLAM HCL 10 MG/2ML IJ SOLN
INTRAMUSCULAR | Status: DC | PRN
  Administered 2015-02-18 (×2): 1 mg via INTRAVENOUS

## 2015-02-18 MED ORDER — GUAIFENESIN 100 MG/5ML PO SYRP
200.0000 mg | ORAL_SOLUTION | Freq: Four times a day (QID) | ORAL | Status: DC | PRN
  Filled 2015-02-18: qty 10

## 2015-02-18 MED ORDER — CITALOPRAM HYDROBROMIDE 10 MG PO TABS
10.0000 mg | ORAL_TABLET | Freq: Every day | ORAL | Status: DC
  Administered 2015-02-19 – 2015-02-20 (×2): 10 mg via ORAL
  Filled 2015-02-18 (×2): qty 1

## 2015-02-18 MED ORDER — SODIUM CHLORIDE 0.9 % IV SOLN: INTRAVENOUS | Status: DC

## 2015-02-18 MED ORDER — AMLODIPINE BESYLATE 5 MG PO TABS
5.0000 mg | ORAL_TABLET | Freq: Every day | ORAL | Status: DC
  Administered 2015-02-18 – 2015-02-20 (×3): 5 mg via ORAL
  Filled 2015-02-18 (×3): qty 1

## 2015-02-18 MED ORDER — MIDAZOLAM HCL 5 MG/ML IJ SOLN
INTRAMUSCULAR | Status: AC
  Filled 2015-02-18: qty 2

## 2015-02-18 NOTE — Progress Notes (Signed)
PROGRESS NOTE  Anne Compton [id number] DOB: 1927-04-28 DOA: 02/14/2015 PCP: Reymundo Poll, MD Brief History 82 year old female with medical history of hyperlipidemia, hypertension, stroke, dementia coming from a nursing home presents with abdominal pain of uncertain duration. Unfortunately, the patient is unable to provide any history secondary to her dementia and encephalopathy. This history is obtained from review of the medical record in speaking with the patient's son, [name]. At baseline, the patient is wheelchair bound, and is able to speak in full sentences although remains pleasantly confused. She is normally on a regular diet without any dysphagia restrictions. Apparently, the patient was noted to have abdominal pain on her right side and epigastric area by the nursing facility personnel. To the son's knowledge, she has not had any problems with fevers, chills, chest pain, shortness breath, vomiting, diarrhea, hematochezia, melena. Upon admission, the patient was noted to have serum creatinine 1.31 and a WBC 15.4. Initial CT of the abdomen and pelvis revealed inflammatory changes about the uncinate process of the pancreas as well as duodenal C-loop. The patient was started on intravenous antibiotics for possible UTI and intravenous fluids. Assessment/Plan: Acute encephalopathy -secondary to underlying infectious process as well as dehydration -significant improvement in past 24-48 hours--son states pt is improved but not yet at baseline (pleasantly confused, but carries on a conversation) -Blood cultures have been obtained, but these were obtained after the patient received ceftriaxone-->neg -urine culture--neg but with clinical improvement with pyuria-->finish 5 days abx-->last dose in am 02/19/15 -TSH--1.208 -Serum B12--322--> since this is on the low normal range, will supplement -Ammonia--21 Abdominal pain with leukocytosis -01/20/17CT abdomen and pelvis inflammatory  changes surrounding the uncinate process of the pancreas as well as duodenal C-loop; distended gallbladder with fat stranding -Abdominal ultrasound shows GB sludge, but negative for gallbladder wall thickening or pericholecystic fluid -lipase and LFTs normal -consulted GI-hold plavix for possible endoscopy--feels abnormality may be PUD -02/18/15 EGD-->duodenal melanosis, hiatus hernia, no endoscopic finding to correlate with CT inflammatory changes -Lactic acid 0.70 Pyuria -continue ceftriaxone -urine culture--neg but with clinical improvement with pyuria-->finish 5 days abx--last dose in am 02/19/15 -WBC continues to improve Normocytic anemia -Baseline hemoglobin 9 -Drop in hemoglobin likely dilution -Transfused 1 unit PRBC Dyspnea/fluid overload -Saline lock IV fluid -02/17/15-Chest x-ray--central vascular congestion -lasix 20 IV given on 1/23 and 1/24-->improving--stable on RA -EKG sinus rhythm with nonspecific ST changes -No anginal symptoms -bmp in am Hypertension -Continue labetalol -Held losartan and Maxzide in the setting of dehydration and CKD -start amlodipine History of stroke  -Hold Plavix for possible endoscopy-->restart 1/25 Dementia with behavioral disturbance -Continue Aricept  -Continue Seroquel at bedtime  Hyperlipidemia  -Continue statin  Hypokalemia  -Repleted -Check magnesium--2.0    Family Communication: Son updated at bedside 1/24 Disposition Plan:SNF on 1/25 if stable Total time 35 min    Procedures/Studies: US Abdomen Complete  02/15/2015  CLINICAL DATA:  82 year old female with abdominal pain EXAM: ABDOMEN ULTRASOUND COMPLETE COMPARISON:  CT dated 02/14/2015 FINDINGS: Gallbladder: There is a sludge within the gallbladder. No gallbladder wall thickening or pericholecystic fluid. Negative sonographic Murphy's sign. Common bile duct: Diameter: 6 mm Liver: Trace free fluid noted around the liver. The liver is otherwise unremarkable. IVC: No  abnormality visualized. Pancreas: Not well visualized. Spleen: Size and appearance within normal limits. Right Kidney: Length: 10 cm the right kidney appears echogenic. No hydronephrosis. Left Kidney: Length: 8 cm. The left kidney also appears mildly echogenic. No hydronephrosis. Abdominal aorta: The  aorta measures up to 2.5 cm on limited evaluation. Other findings: None. IMPRESSION: Gallbladder sludge without sonographic evidence of acute cholecystitis. No hydronephrosis. Electronically Signed   By: Anner Crete M.D.   On: 02/15/2015 04:57   Ct Abdomen Pelvis W Contrast  02/15/2015  ADDENDUM REPORT: 02/15/2015 04:59 ADDENDUM: There is diverticulosis of the colon with diverticula seen at the hepatic flexure. Small amount of inflammatory fluid surrounding the diverticula may be extension from the inflammatory changes of the duodenum. Diverticulitis is less likely but not excluded. Clinical correlation is recommended. Electronically Signed   By: Anner Crete M.D.   On: 02/15/2015 04:59  02/15/2015  CLINICAL DATA:  82 year old female with abdominal pain. EXAM: CT ABDOMEN AND PELVIS WITH CONTRAST TECHNIQUE: Multidetector CT imaging of the abdomen and pelvis was performed using the standard protocol following bolus administration of intravenous contrast. CONTRAST:  64mL OMNIPAQUE IOHEXOL 300 MG/ML  SOLN COMPARISON:  CT dated 02/08/2013 FINDINGS: Evaluation of the study is limited due to streak artifact caused by patient's arms. There is minimal bibasilar dependent atelectatic changes. Small left posterior diaphragmatic defect with herniation of small amount of abdominal fat compatible with a Bochdalek hernia. No intra-abdominal free air or free fluid. The gallbladder is distended. There is stranding in the fat surrounding the gallbladder and within the forehead status. No definite calcified stone identified within the gallbladder. There is an ill-defined 7 mm high attenuating focus (series 3, image 25 and  coronal series 6, image 45) which may represent volume averaging artifact with adjacent vessel or stone within the neck of the gallbladder or in the cystic duct. Ultrasound is recommended for better evaluation of the gallbladder. There is inflammatory changes of the and uncinate process of the pancreas as well as inflammatory changes of the duodenal C-loop may represent pancreatitis versus duodenitis. Correlation with pancreatic enzymes recommended. No drainable fluid collection/abscess identified. The spleen, adrenal glands appear unremarkable. There is mild bilateral renal atrophy. There is no hydronephrosis on either side. The visualized ureters appear unremarkable. The urinary bladder is collapsed. There is diffuse thickening of the bladder wall. Cystitis or an infiltrative process is not excluded. Correlation with urinalysis and urine cytology and further evaluation with cystoscopy if clinically indicated recommended. There is calcification of the uterus compatible with fibroids. There is extensive sigmoid diverticulosis without active inflammatory changes. There is no evidence of bowel obstruction. A moderate size hiatal hernia is seen. There is inflammatory changes of the distal stomach and duodenal C-loop as described may represent gastroenteritis or be related to pancreatitis. A 2.0 x 1.7 cm nodular soft tissue density in the region of the duodenal bulb may represent oral content within the lumen. The soft tissue lesion is not excluded. Upper GI study or endoscopy is recommended for better evaluation. There is aortoiliac atherosclerotic disease. The abdominal aorta and IVC appear patent. No portal venous gas identified. There is no adenopathy. There is a small fat containing umbilical hernia. The abdominal wall soft tissues are grossly unremarkable. There is degenerative changes of the spine. No acute fracture. IMPRESSION: Inflammatory changes of the porta hepaticus, surrounding the head and uncinate  process of the pancreas and duodenal C-loop. Findings may represent pancreatitis or gastroduodenitis. Clinical correlation is recommended. Ill-defined high attenuating area along the course of the cystic duct may be volume averaging artifact with the adjacent vessels or represent stone within the cystic duct. Ultrasound is recommended for better evaluation of the gallbladder. Soft tissue appearing structure within the region of the duodenal bulb may represent  oral content, or luminal fold. The soft tissue lesion is not excluded. Endoscopy or upper GI study may provide better evaluation. Diffuse thickening of the bladder wall may represent cystitis or an infiltrative process. Correlation with urinalysis and further evaluation with endoscopy if clinically indicated recommended. Sigmoid diverticulosis.  No evidence of bowel obstruction. Electronically Signed: By: Anner Crete M.D. On: 02/14/2015 22:01   Dg Chest Port 1 View  02/17/2015  CLINICAL DATA:  Dyspnea EXAM: PORTABLE CHEST 1 VIEW COMPARISON:  Radiograph 02/14/2015 FINDINGS: Normal cardiac silhouette. Low lung volumes. Mild central venous congestion. No focal consolidation. Small LEFT effusion. No pneumothorax. IMPRESSION: Low lung volumes with central venous congestion. Small LEFT effusion. Electronically Signed   By: Suzy Bouchard M.D.   On: 02/17/2015 15:46   Dg Chest Port 1 View  02/14/2015  CLINICAL DATA:  Right lower quadrant pain, dementia EXAM: PORTABLE CHEST 1 VIEW COMPARISON:  11/03/2014 FINDINGS: Cardiomediastinal silhouette is stable. No acute infiltrate or pleural effusion. No pulmonary edema. Elevation of the right hemidiaphragm again noted. IMPRESSION: No active disease. Elevation of the right hemidiaphragm again noted. Electronically Signed   By: Lahoma Crocker M.D.   On: 02/14/2015 23:21         Subjective: Patient denies fevers, chills, headache, chest pain, dyspnea, nausea, vomiting, diarrhea, abdominal pain, dysuria,  hematuria   Objective: Filed Vitals:   02/18/15 1540 02/18/15 1545 02/18/15 1601 02/18/15 1610  BP: 195/71 160/59 186/84 187/82  Pulse: 95 86 75 67  Temp:   99.3 F (37.4 C)   TempSrc:   Oral   Resp: 21 25 20 15   Height:      Weight:      SpO2: 100% 96% 97% 99%    Intake/Output Summary (Last 24 hours) at 02/18/15 1739 Last data filed at 02/17/15 2142  Gross per 24 hour  Intake    335 ml  Output      0 ml  Net    335 ml   Weight change: -0.283 kg (-10 oz) Exam:   General:  Pt is alert, follows commands appropriately, not in acute distress  HEENT: No icterus, No thrush, No neck mass, [place]/AT  Cardiovascular: RRR, S1/S2, no rubs, no gallops  Respiratory: Bibasilar crackles. No wheezing. Good air movement.  Abdomen: Soft/+BS, non tender, non distended, no guarding; no hepatosplenomegaly  Extremities: No edema, No lymphangitis, No petechiae, No rashes, no synovitis; no cyanosis or clubbing  Data Reviewed: Basic Metabolic Panel:  Recent Labs Lab 02/14/15 2030 02/15/15 0552 02/16/15 0522 02/17/15 0413 02/18/15 0622  NA 136 139 144 145 147*  K 3.6 3.2* 3.4* 3.7 3.4*  CL 100* 101 110 112* 109  CO2 27 29 28 25 27   GLUCOSE 163* 154* 136* 122* 136*  BUN 24* 20 15 14 13   CREATININE 1.31* 1.28* 1.26* 1.08* 0.98  CALCIUM 9.0 9.0 8.5* 8.1* 8.6*  MG  --   --   --  2.0  --    Liver Function Tests:  Recent Labs Lab 02/14/15 2030  AST 26  ALT 14  ALKPHOS 56  BILITOT 0.7  PROT 7.2  ALBUMIN 3.5    Recent Labs Lab 02/14/15 2030  LIPASE 17    Recent Labs Lab 02/17/15 0412  AMMONIA 21   CBC:  Recent Labs Lab 02/14/15 2030 02/15/15 0552 02/16/15 0522 02/16/15 1430 02/17/15 0413 02/18/15 0622  WBC 15.4* 15.7* 13.6* 13.8* 12.4* 11.8*  NEUTROABS 13.8*  --   --   --   --   --  HGB 9.4* 8.3* 7.6* 7.5* 7.0* 8.8*  HCT 30.5* 25.8* 23.9* 23.4* 22.5* 28.3*  MCV 85.7 83.5 85.1 86.0 85.6 85.5  PLT 307 271 239 242 231 265   Cardiac Enzymes:  Recent  Labs Lab 02/14/15 2031  TROPONINI 0.03   BNP: Invalid input(s): POCBNP CBG: No results for input(s): GLUCAP in the last 168 hours.  Recent Results (from the past 240 hour(s))  Urine culture     Status: None   Collection Time: 02/14/15  8:20 PM  Result Value Ref Range Status   Specimen Description URINE, CATHETERIZED  Final   Special Requests NONE  Final   Culture   Final    NO GROWTH 2 DAYS Performed at [facility]    Report Status 02/16/2015 FINAL  Final  MRSA PCR Screening     Status: Abnormal   Collection Time: 02/15/15  2:16 AM  Result Value Ref Range Status   MRSA by PCR POSITIVE (A) NEGATIVE Final    Comment:        The GeneXpert MRSA Assay (FDA approved for NASAL specimens only), is one component of a comprehensive MRSA colonization surveillance program. It is not intended to diagnose MRSA infection nor to guide or monitor treatment for MRSA infections. RESULT CALLED TO, READ BACK BY AND VERIFIED WITH: C DULIAO,RN @0518  02/15/15 MKELLY   Culture, blood (routine x 2)     Status: None (Preliminary result)   Collection Time: 02/15/15 10:30 AM  Result Value Ref Range Status   Specimen Description BLOOD LEFT ANTECUBITAL  Final   Special Requests BOTTLES DRAWN AEROBIC ONLY [place]  Final   Culture NO GROWTH 3 DAYS  Final   Report Status PENDING  Incomplete  Culture, blood (routine x 2)     Status: None (Preliminary result)   Collection Time: 02/15/15 10:35 AM  Result Value Ref Range Status   Specimen Description BLOOD BLOOD LEFT HAND  Final   Special Requests IN PEDIATRIC BOTTLE 3CC  Final   Culture NO GROWTH 3 DAYS  Final   Report Status PENDING  Incomplete     Scheduled Meds: . acetaminophen  650 mg Oral BID  . atorvastatin  10 mg Oral Daily  . cefTRIAXone (ROCEPHIN)  IV  2 g Intravenous Q24H  . Chlorhexidine Gluconate Cloth  6 each Topical Q0600  . cholecalciferol  1,000 Units Oral q morning - 10a  . citalopram  10 mg Oral Daily  . citalopram  20  mg Oral Daily  . donepezil  10 mg Oral QHS  . famotidine  10 mg Oral Daily  . feeding supplement (ENSURE ENLIVE)  237 mL Oral Q1500  . ferrous sulfate  325 mg Oral Q breakfast  . fluticasone  2 spray Each Nare Daily  . heparin  5,000 Units Subcutaneous 3 times per day  . labetalol  100 mg Oral TID  . multivitamin with minerals  1 tablet Oral Daily  . mupirocin ointment  1 application Nasal BID  . pantoprazole (PROTONIX) IV  40 mg Intravenous Q12H  . QUEtiapine  12.5 mg Oral Daily  . QUEtiapine  50 mg Oral QHS  . QUEtiapine  50 mg Oral QHS  . sodium chloride  3 mL Intravenous Q12H  . traMADol  50 mg Oral BID  . vitamin B-12  250 mcg Oral Daily   Continuous Infusions:    Anne Pullman, DO  [facility] Pager [phone]  If 7PM-7AM, please contact night-coverage [email or web address] Password [facility] 02/18/2015, 5:39 PM  LOS: 3 days

## 2015-02-18 NOTE — Progress Notes (Signed)
Consent signed by son while he was here visiting mother.

## 2015-02-18 NOTE — Op Note (Signed)
[facility] [place], [id number]   ENDOSCOPY PROCEDURE REPORT  PATIENT: Anne Compton, Anne Compton  MR#: [id number] BIRTHDATE: 09/27/1927 , 70  yrs. old GENDER: female ENDOSCOPIST:Markiya Keefe Benson Norway, MD REFERRED BY: PROCEDURE DATE:  03-14-15 PROCEDURE:   EGD, diagnostic ASA CLASS:    Class III INDICATIONS: Abnormall CT scan MEDICATION: Versed 2 mg IV and Fentanyl 25 mcg IV TOPICAL ANESTHETIC:   none  DESCRIPTION OF PROCEDURE:   After the risks and benefits of the procedure were explained, informed consent was obtained.  The Pentax Gastroscope [id number]  endoscope was introduced through the mouth  and advanced to the second portion of the duodenum .  The instrument was slowly withdrawn as the mucosa was fully examined. Estimated blood loss is zero unless otherwise noted in this procedure report.   FINDINGS: In the distal esophagus a 4 cm hiatal hernia was found. The gastric mucosa was normal.  In the duodenal lumen, the mucosa exhibited melanosis.  No evidence of any inflammation, ulcerations, erosions, polyps, masses, or vascular abnormalities in the upper GI tract to correlate with the CT scan findings.          The scope was then withdrawn from the patient and the procedure completed.  COMPLICATIONS: There were no immediate complications.  ENDOSCOPIC IMPRESSION: 1) 4 cm hiatal hernia. 2) Duodenal melanosis. RECOMMENDATIONS:    _______________________________ eSignedCarol Ada, MD 03-14-15 3:53 PM     cc: CPT CODES: ICD CODES:

## 2015-02-19 ENCOUNTER — Encounter (HOSPITAL_COMMUNITY): Payer: Self-pay | Admitting: Gastroenterology

## 2015-02-19 ENCOUNTER — Inpatient Hospital Stay (HOSPITAL_COMMUNITY): Payer: Medicare Other

## 2015-02-19 DIAGNOSIS — E876 Hypokalemia: Secondary | ICD-10-CM

## 2015-02-19 DIAGNOSIS — G9340 Encephalopathy, unspecified: Secondary | ICD-10-CM

## 2015-02-19 DIAGNOSIS — R0600 Dyspnea, unspecified: Secondary | ICD-10-CM

## 2015-02-19 DIAGNOSIS — R1010 Upper abdominal pain, unspecified: Secondary | ICD-10-CM

## 2015-02-19 LAB — BASIC METABOLIC PANEL
ANION GAP: 11 (ref 5–15)
BUN: 14 mg/dL (ref 6–20)
CHLORIDE: 102 mmol/L (ref 101–111)
CO2: 29 mmol/L (ref 22–32)
Calcium: 8.7 mg/dL — ABNORMAL LOW (ref 8.9–10.3)
Creatinine, Ser: 1 mg/dL (ref 0.44–1.00)
GFR calc non Af Amer: 50 mL/min — ABNORMAL LOW (ref >60)
GFR, EST AFRICAN AMERICAN: 57 mL/min — AB (ref >60)
Glucose, Bld: 126 mg/dL — ABNORMAL HIGH (ref 65–99)
POTASSIUM: 3.4 mmol/L — AB (ref 3.5–5.1)
SODIUM: 142 mmol/L (ref 135–145)

## 2015-02-19 LAB — CBC
HEMATOCRIT: 29.7 % — AB (ref 36.0–46.0)
HEMOGLOBIN: 9.1 g/dL — AB (ref 12.0–15.0)
MCH: 26.1 pg (ref 26.0–34.0)
MCHC: 30.6 g/dL (ref 30.0–36.0)
MCV: 85.3 fL (ref 78.0–100.0)
Platelets: 275 10*3/uL (ref 150–400)
RBC: 3.48 MIL/uL — AB (ref 3.87–5.11)
RDW: 14.9 % (ref 11.5–15.5)
WBC: 9.6 10*3/uL (ref 4.0–10.5)

## 2015-02-19 MED ORDER — POTASSIUM CHLORIDE CRYS ER 20 MEQ PO TBCR
40.0000 meq | EXTENDED_RELEASE_TABLET | Freq: Once | ORAL | Status: DC
  Filled 2015-02-19: qty 2

## 2015-02-19 MED ORDER — PANTOPRAZOLE SODIUM 40 MG PO TBEC
40.0000 mg | DELAYED_RELEASE_TABLET | Freq: Two times a day (BID) | ORAL | Status: DC
  Administered 2015-02-19 – 2015-02-20 (×3): 40 mg via ORAL
  Filled 2015-02-19 (×3): qty 1

## 2015-02-19 NOTE — NC FL2 (Signed)
[place] MEDICAID FL2 LEVEL OF CARE SCREENING TOOL     IDENTIFICATION  Patient Name: Anne Compton Birthdate: 1927-11-23 Sex: female Admission Date (Current Location): 02/14/2015  [facility] and [place] Number:  Herbalist and Address:  [facility] [address], [place], [place] [address]      Provider Number: [id number]  Attending Physician Name and Address:  [name], MD  Relative Name and Phone Number:       Current Level of Care: Hospital Recommended Level of Care: [place] Prior Approval Number: 03-27-2009  Date Approved/Denied: 03/27/09 PASRR Number: [id number]  Discharge Plan: SNF    Current Diagnoses: Patient Active Problem List   Diagnosis Date Noted  . Leukocytosis 02/15/2015  . UTI (urinary tract infection) 02/15/2015  . Anemia 02/15/2015  . Acute encephalopathy 02/15/2015  . Hypokalemia 02/15/2015  . Abdominal pain 02/14/2015  . Status post nail surgery 11/04/2014  . Ingrown nail 10/28/2014  . Paronychia 10/28/2014  . Onychomycosis 10/28/2014  . Vascular dementia     Orientation RESPIRATION BLADDER Height & Weight    Self  O2 (2 liters) Incontinent 5\' 3"  (160 cm) 164 lbs.  BEHAVIORAL SYMPTOMS/MOOD NEUROLOGICAL BOWEL NUTRITION STATUS      Incontinent    AMBULATORY STATUS COMMUNICATION OF NEEDS Skin   Extensive Assist Verbally Normal                       Personal Care Assistance Level of Assistance  Bathing, Feeding, Dressing Bathing Assistance:  (TBA) Feeding assistance:  (TBA)       Functional Limitations Info  Sight, Hearing, Speech Sight Info: Impaired Hearing Info: Impaired Speech Info: Impaired (delayed )    SPECIAL CARE FACTORS FREQUENCY  PT (By licensed PT), OT (By licensed OT)     PT Frequency: To be assessed  OT Frequency: To be assessed             Contractures      Additional Factors Info  Code Status, Allergies Code Status Info: FULL Allergies  Info: Penicillins           Current Medications (02/19/2015):  This is the current hospital active medication list Current Facility-Administered Medications  Medication Dose Route Frequency Provider Last Rate Last Dose  . acetaminophen (TYLENOL) tablet 650 mg  650 mg Oral Q6H PRN Norval Morton, MD   650 mg at 02/19/15 1045   Or  . acetaminophen (TYLENOL) suppository 650 mg  650 mg Rectal Q6H PRN Norval Morton, MD      . acetaminophen (TYLENOL) tablet 650 mg  650 mg Oral BID Norval Morton, MD   650 mg at 02/19/15 1000  . albuterol (PROVENTIL) (2.5 MG/3ML) 0.083% nebulizer solution 2.5 mg  2.5 mg Nebulization Q2H PRN Norval Morton, MD   2.5 mg at 02/18/15 0502  . amLODipine (NORVASC) tablet 5 mg  5 mg Oral Daily Orson Eva, MD   5 mg at 02/19/15 1044  . atorvastatin (LIPITOR) tablet 10 mg  10 mg Oral Daily Norval Morton, MD   10 mg at 02/19/15 1045  . cefTRIAXone (ROCEPHIN) 2 g in dextrose 5 % 50 mL IVPB  2 g Intravenous Q24H Orson Eva, MD   2 g at 02/19/15 0315  . cholecalciferol (VITAMIN D) tablet 1,000 Units  1,000 Units Oral q morning - 10a Norval Morton, MD   1,000 Units at 02/19/15 1045  . citalopram (CELEXA) tablet  10 mg  10 mg Oral Daily Carol Ada, MD   10 mg at 02/19/15 1045  . citalopram (CELEXA) tablet 20 mg  20 mg Oral Daily Norval Morton, MD   20 mg at 02/19/15 1057  . donepezil (ARICEPT) tablet 10 mg  10 mg Oral QHS Rondell A Tamala Julian, MD   10 mg at 02/18/15 2311  . famotidine (PEPCID) tablet 10 mg  10 mg Oral Daily Norval Morton, MD   10 mg at 02/19/15 1056  . feeding supplement (ENSURE ENLIVE) (ENSURE ENLIVE) liquid 237 mL  237 mL Oral Q1500 Norval Morton, MD   237 mL at 02/19/15 1459  . ferrous sulfate tablet 325 mg  325 mg Oral Q breakfast Norval Morton, MD   325 mg at 02/19/15 [id number]  . fluticasone (FLONASE) 50 MCG/ACT nasal spray 2 spray  2 spray Each Nare Daily Norval Morton, MD   2 spray at 02/19/15 1000  . guaifenesin (ROBITUSSIN) 100 MG/5ML syrup 200  mg  200 mg Oral Q6H PRN Carol Ada, MD      . heparin injection 5,000 Units  5,000 Units Subcutaneous 3 times per day Norval Morton, MD   5,000 Units at 02/19/15 1448  . hydrALAZINE (APRESOLINE) injection 10 mg  10 mg Intravenous Q6H PRN Dianne Dun, NP   10 mg at 02/18/15 1925  . labetalol (NORMODYNE) tablet 100 mg  100 mg Oral TID Norval Morton, MD   100 mg at 02/19/15 1046  . LORazepam (ATIVAN) tablet 0.5 mg  0.5 mg Oral BID PRN Norval Morton, MD      . multivitamin with minerals tablet 1 tablet  1 tablet Oral Daily Norval Morton, MD   1 tablet at 02/19/15 1046  . mupirocin ointment (BACTROBAN) 2 % 1 application  1 application Nasal BID Norval Morton, MD   1 application at [id number]  . ondansetron (ZOFRAN) tablet 4 mg  4 mg Oral Q6H PRN Norval Morton, MD       Or  . ondansetron (ZOFRAN) injection 4 mg  4 mg Intravenous Q6H PRN Rondell A Tamala Julian, MD      . pantoprazole (PROTONIX) EC tablet 40 mg  40 mg Oral BID Gaynell Face Hiatt, RPH   40 mg at 02/19/15 1133  . QUEtiapine (SEROQUEL) tablet 12.5 mg  12.5 mg Oral Daily Norval Morton, MD   12.5 mg at 02/19/15 1046  . QUEtiapine (SEROQUEL) tablet 50 mg  50 mg Oral QHS Norval Morton, MD   50 mg at 02/17/15 2141  . QUEtiapine (SEROQUEL) tablet 50 mg  50 mg Oral QHS Carol Ada, MD   50 mg at 02/18/15 2311  . sodium chloride 0.9 % injection 3 mL  3 mL Intravenous Q12H Rondell Charmayne Sheer, MD   3 mL at 02/19/15 1000  . traMADol (ULTRAM) tablet 50 mg  50 mg Oral BID Norval Morton, MD   50 mg at 02/19/15 1046  . vitamin B-12 (CYANOCOBALAMIN) tablet 250 mcg  250 mcg Oral Daily Orson Eva, MD   250 mcg at 02/19/15 1045     Discharge Medications: Please see discharge summary for a list of discharge medications.  Relevant Imaging Results:  Relevant Lab Results:   Additional Information SS#: [id number]  Raymondo Band, LCSW

## 2015-02-19 NOTE — Progress Notes (Signed)
*  PRELIMINARY RESULTS* Echocardiogram 2D Echocardiogram has been performed.  Leavy Cella 02/19/2015, 3:30 PM

## 2015-02-19 NOTE — Progress Notes (Signed)
RT called for assessment. Pt in no distress. BBS clr

## 2015-02-19 NOTE — Progress Notes (Signed)
CSW attempted to get in contact with patient's sons [name] and Darnelle Maffucci but received no answer. CSW left voice message for number listed as [phone] at 3:09pm. CSW also attempted to contact Abigail Butts from contact list but received no answer. CSW will continue to follow up regarding patient's psychosocial information.   Lucius Conn, [place] Worker [facility] Ph: [phone]

## 2015-02-19 NOTE — Progress Notes (Signed)
[facility] PROGRESS NOTE  Anne Compton [id number] DOB: [birth year] DOA: 02/14/2015 PCP: Reymundo Poll, MD  Assessment/Plan:  Acute encephalopathy -Resolved -secondary to underlying infectious process as well as dehydration -significant improvement in past 24-48 hours--son states pt is improved but not yet at baseline (pleasantly confused, but carries on a conversation) -Blood cultures have been obtained, but these were obtained after the patient received ceftriaxone-->neg -urine culture--neg but with clinical improvement with pyuria-->finish 5 days abx-->last dose this morning -TSH--1.208 -Serum B12--322--> since this is on the low normal range, will supplement -Ammonia--21  Abdominal pain with leukocytosis -01/20/17CT abdomen and pelvis inflammatory changes surrounding the uncinate process of the pancreas as well as duodenal C-loop; distended gallbladder with fat stranding -Abdominal ultrasound shows GB sludge, but negative for gallbladder wall thickening or pericholecystic fluid -lipase and LFTs normal -consulted GI-hold plavix for possible endoscopy--EGD shows duodenal melanosis, 4 cm hiatal hernia -02/18/15 EGD-->duodenal melanosis, hiatus hernia, no endoscopic finding to correlate with CT inflammatory changes -Lactic acid 0.70  Pyuria -continue ceftriaxone -urine culture--neg but with clinical improvement with pyuria-->finish 5 days abx--last dose in am 02/19/15 -WBC continues to improve  Normocytic anemia -Baseline hemoglobin 9 -Drop in hemoglobin likely dilution -Transfused 1 unit PRBC  Dyspnea/fluid overload -Saline lock IV fluid -02/17/15-Chest x-ray--central vascular congestion -lasix 20 IV given on 1/23 and 1/24-->improving--stable on RA -EKG sinus rhythm with nonspecific ST changes -No anginal symptoms - echocardiogram done , result is pending.  Hypertension -Continue labetalol -Held losartan and Maxzide in the setting of dehydration and CKD -start  amlodipine  History of stroke  -Hold Plavix for possible endoscopy-->restart 1/25  Dementia with behavioral disturbance -Continue Aricept  -Continue Seroquel at bedtime   Hyperlipidemia  -Continue statin   Hypokalemia  -Repleted -Check magnesium--2.0    Code Status: Full code Family Communication: Discussed with patient's son at bedside Disposition Plan: [place], when medically stable   Consultants:  None  Procedures:  None  Antibiotics:  Ceftriaxone  HPI/Subjective: 82 year old female with medical history of hyperlipidemia, hypertension, stroke, dementia coming from a nursing home presents with abdominal pain of uncertain duration. Unfortunately, the patient is unable to provide any history secondary to her dementia and encephalopathy. This history is obtained from review of the medical record in speaking with the patient's son, [name]. At baseline, the patient is wheelchair bound, and is able to speak in full sentences although remains pleasantly confused. She is normally on a regular diet without any dysphagia restrictions. Apparently, the patient was noted to have abdominal pain on her right side and epigastric area by the nursing facility personnel. To the son's knowledge, she has not had any problems with fevers, chills, chest pain, shortness breath, vomiting, diarrhea, hematochezia, melena. Upon admission, the patient was noted to have serum creatinine 1.31 and a WBC 15.4. Initial CT of the abdomen and pelvis revealed inflammatory changes about the uncinate process of the pancreas as well as duodenal C-loop. The patient was started on intravenous antibiotics for possible UTI and intravenous fluids.   Patient denies any chest pain or shortness of breath this morning Objective: Filed Vitals:   02/19/15 1410 02/19/15 1455  BP:  130/62  Pulse:  62  Temp: 98.7 F (37.1 C)   Resp:      Intake/Output Summary (Last 24 hours) at 02/19/15 1547 Last  data filed at 02/19/15 1520  Gross per 24 hour  Intake    350 ml  Output      0 ml  Net  350 ml   Filed Weights   02/16/15 0609 02/17/15 0538 02/18/15 0346  Weight: 74.662 kg (164 lb 9.6 oz) 74.9 kg (165 lb 2 oz) 74.617 kg (164 lb 8 oz)    Exam:   General:  Appears in no acute distress  Cardiovascular: S1-S2 regular  Respiratory: Clear to auscultation bilaterally  Abdomen: Soft, nontender, no organomegaly  Musculoskeletal: No cyanosis/clubbing/edema. Extremities   Data Reviewed: Basic Metabolic Panel:  Recent Labs Lab 02/15/15 0552 02/16/15 0522 02/17/15 0413 02/18/15 0622 02/19/15 0350  NA 139 144 145 147* 142  K 3.2* 3.4* 3.7 3.4* 3.4*  CL 101 110 112* 109 102  CO2 29 28 25 27 29   GLUCOSE 154* 136* 122* 136* 126*  BUN 20 15 14 13 14   CREATININE 1.28* 1.26* 1.08* 0.98 1.00  CALCIUM 9.0 8.5* 8.1* 8.6* 8.7*  MG  --   --  2.0  --   --    Liver Function Tests:  Recent Labs Lab 02/14/15 2030  AST 26  ALT 14  ALKPHOS 56  BILITOT 0.7  PROT 7.2  ALBUMIN 3.5    Recent Labs Lab 02/14/15 2030  LIPASE 17    Recent Labs Lab 02/17/15 0412  AMMONIA 21   CBC:  Recent Labs Lab 02/14/15 2030  02/16/15 0522 02/16/15 1430 02/17/15 0413 02/18/15 0622 02/19/15 0350  WBC 15.4*  < > 13.6* 13.8* 12.4* 11.8* 9.6  NEUTROABS 13.8*  --   --   --   --   --   --   HGB 9.4*  < > 7.6* 7.5* 7.0* 8.8* 9.1*  HCT 30.5*  < > 23.9* 23.4* 22.5* 28.3* 29.7*  MCV 85.7  < > 85.1 86.0 85.6 85.5 85.3  PLT 307  < > 239 242 231 265 275  < > = values in this interval not displayed. Cardiac Enzymes:  Recent Labs Lab 02/14/15 2031  TROPONINI 0.03     Recent Results (from the past 240 hour(s))  Urine culture     Status: None   Collection Time: 02/14/15  8:20 PM  Result Value Ref Range Status   Specimen Description URINE, CATHETERIZED  Final   Special Requests NONE  Final   Culture   Final    NO GROWTH 2 DAYS Performed at [facility]    Report Status  02/16/2015 FINAL  Final  MRSA PCR Screening     Status: Abnormal   Collection Time: 02/15/15  2:16 AM  Result Value Ref Range Status   MRSA by PCR POSITIVE (A) NEGATIVE Final    Comment:        The GeneXpert MRSA Assay (FDA approved for NASAL specimens only), is one component of a comprehensive MRSA colonization surveillance program. It is not intended to diagnose MRSA infection nor to guide or monitor treatment for MRSA infections. RESULT CALLED TO, READ BACK BY AND VERIFIED WITH: C DULIAO,RN @0518  02/15/15 MKELLY   Culture, blood (routine x 2)     Status: None (Preliminary result)   Collection Time: 02/15/15 10:30 AM  Result Value Ref Range Status   Specimen Description BLOOD LEFT ANTECUBITAL  Final   Special Requests BOTTLES DRAWN AEROBIC ONLY [place]  Final   Culture NO GROWTH 4 DAYS  Final   Report Status PENDING  Incomplete  Culture, blood (routine x 2)     Status: None (Preliminary result)   Collection Time: 02/15/15 10:35 AM  Result Value Ref Range Status   Specimen Description BLOOD BLOOD LEFT HAND  Final   Special Requests IN PEDIATRIC BOTTLE 3CC  Final   Culture NO GROWTH 4 DAYS  Final   Report Status PENDING  Incomplete     Studies: No results found.  Scheduled Meds: . acetaminophen  650 mg Oral BID  . amLODipine  5 mg Oral Daily  . atorvastatin  10 mg Oral Daily  . cefTRIAXone (ROCEPHIN)  IV  2 g Intravenous Q24H  . cholecalciferol  1,000 Units Oral q morning - 10a  . citalopram  10 mg Oral Daily  . citalopram  20 mg Oral Daily  . donepezil  10 mg Oral QHS  . famotidine  10 mg Oral Daily  . feeding supplement (ENSURE ENLIVE)  237 mL Oral Q1500  . ferrous sulfate  325 mg Oral Q breakfast  . fluticasone  2 spray Each Nare Daily  . heparin  5,000 Units Subcutaneous 3 times per day  . labetalol  100 mg Oral TID  . multivitamin with minerals  1 tablet Oral Daily  . mupirocin ointment  1 application Nasal BID  . pantoprazole  40 mg Oral BID  . QUEtiapine   12.5 mg Oral Daily  . QUEtiapine  50 mg Oral QHS  . QUEtiapine  50 mg Oral QHS  . sodium chloride  3 mL Intravenous Q12H  . traMADol  50 mg Oral BID  . vitamin B-12  250 mcg Oral Daily   Continuous Infusions:   Principal Problem:   Abdominal pain Active Problems:   Vascular dementia   Leukocytosis   UTI (urinary tract infection)   Anemia   Acute encephalopathy   Hypokalemia    Time spent: 25 min    [facility]  [facility] Pager [phone]*. If 7PM-7AM, please contact night-coverage at [email or web address], password [facility] 02/19/2015, 3:47 PM  LOS: 4 days

## 2015-02-20 LAB — BASIC METABOLIC PANEL
ANION GAP: 9 (ref 5–15)
ANION GAP: 11 (ref 5–15)
BUN: 14 mg/dL (ref 6–20)
BUN: 15 mg/dL (ref 6–20)
CO2: 29 mmol/L (ref 22–32)
CO2: 27 mmol/L (ref 22–32)
Calcium: 8.3 mg/dL — ABNORMAL LOW (ref 8.9–10.3)
Calcium: 8.3 mg/dL — ABNORMAL LOW (ref 8.9–10.3)
Chloride: 103 mmol/L (ref 101–111)
Chloride: 101 mmol/L (ref 101–111)
Creatinine, Ser: 1 mg/dL (ref 0.44–1.00)
Creatinine, Ser: 0.92 mg/dL (ref 0.44–1.00)
GFR calc Af Amer: >60 mL/min (ref >60)
GFR, EST AFRICAN AMERICAN: 57 mL/min — AB (ref >60)
GFR, EST NON AFRICAN AMERICAN: 50 mL/min — AB (ref >60)
GFR, EST NON AFRICAN AMERICAN: 55 mL/min — AB (ref >60)
Glucose, Bld: 108 mg/dL — ABNORMAL HIGH (ref 65–99)
Glucose, Bld: 139 mg/dL — ABNORMAL HIGH (ref 65–99)
POTASSIUM: 3.2 mmol/L — AB (ref 3.5–5.1)
POTASSIUM: 3.9 mmol/L (ref 3.5–5.1)
SODIUM: 141 mmol/L (ref 135–145)
SODIUM: 139 mmol/L (ref 135–145)

## 2015-02-20 LAB — CULTURE, BLOOD (ROUTINE X 2)
CULTURE: NO GROWTH
Culture: NO GROWTH

## 2015-02-20 MED ORDER — PANTOPRAZOLE SODIUM 40 MG PO TBEC: 40.0000 mg | DELAYED_RELEASE_TABLET | Freq: Two times a day (BID) | ORAL | Status: DC

## 2015-02-20 MED ORDER — TRAMADOL HCL 50 MG PO TABS: 50.0000 mg | ORAL_TABLET | Freq: Two times a day (BID) | ORAL | Status: AC

## 2015-02-20 MED ORDER — CYANOCOBALAMIN 250 MCG PO TABS: 250.0000 ug | ORAL_TABLET | Freq: Every day | ORAL | Status: DC

## 2015-02-20 MED ORDER — PANTOPRAZOLE SODIUM 40 MG PO TBEC: 40.0000 mg | DELAYED_RELEASE_TABLET | Freq: Every day | ORAL | Status: DC

## 2015-02-20 MED ORDER — LORAZEPAM 0.5 MG PO TABS: 0.5000 mg | ORAL_TABLET | Freq: Two times a day (BID) | ORAL | Status: DC | PRN

## 2015-02-20 MED ORDER — POTASSIUM CHLORIDE 10 MEQ/100ML IV SOLN
10.0000 meq | INTRAVENOUS | Status: AC
  Administered 2015-02-20 (×3): 10 meq via INTRAVENOUS
  Filled 2015-02-20 (×3): qty 100

## 2015-02-20 NOTE — Progress Notes (Addendum)
OT Cancellation Note  Patient Details Name: Anne Compton MRN: [id number] DOB: 01-25-1928   Cancelled Treatment:    Reason Eval/Treat Not Completed: OT screened, no needs identified, will sign off. Per discussion with PT and chart review pt is from SNF and total A for ADLs at baseline. Will defer OT needs to SNF. Acute OT signing off.  Hortencia Pilar 02/20/2015, 11:05 AM

## 2015-02-20 NOTE — Clinical Social Work Note (Cosign Needed)
Clinical Social Work Assessment  Patient Details  Name: Anne Compton MRN: [id number] Date of Birth: 02/12/1927  Date of referral:  02/20/15               Reason for consult:  Facility Placement                Permission sought to share information with:    Permission granted to share information::  Yes, Verbal Permission Granted  Name::        Agency::     Relationship::     Contact Information:     Housing/Transportation Living arrangements for the past 2 months:  [place] ([place] (memory unit[place] Source of Information:  Adult Children Anne Compton (son) [phone]) Patient Interpreter Needed:  None Criminal Activity/Legal Involvement Pertinent to Current Situation/Hospitalization:  No - Comment as needed Significant Relationships:  Adult Children Lives with:  Facility Resident Do you feel safe going back to the place where you live?  Yes Need for family participation in patient care:  Yes (Comment)  Care giving concerns:  Patient recently had stomach pain and had to be transported form [place] to [employer]. PT has not assess patient yet.   Social Worker assessment / plan: BSW intern called son Anne Compton to discuss discharge planning . BSW intern introduce herself and did an assessment via telephone. Son stated mom  recently had stomach pain at [place] and had to be transported to [facility]. Son stated that patient is from [place] the memory unit and would be returning upon discharge from the hospital. Son says he would like to transport his mom back to [place] when discharge. Patient has not been seen by PT yet for any other recommendation such as skilled. BSW intern will be on the look out for PT notes.   Employment status:  Disabled (Comment on whether or not currently receiving Disability) Insurance information:    PT Recommendations:  [place] /  Referral to community resources:  [place]  Patient/Family's Response to care:  Patient son is agreeable for mom to return back to [place] once medically stable.   Patient/Family's Understanding of and Emotional Response to Diagnosis, Current Treatment, and Prognosis: patient son aware of current diagnosis and treatment plan.  Emotional Assessment Appearance:  Appears stated age Attitude/Demeanor/Rapport:  Unable to Assess Affect (typically observed):  Unable to Assess Orientation:   (disoriented) Alcohol / Substance use:  Never Used Psych involvement (Current and /or in the community):  No (Comment)  Discharge Needs  Concerns to be addressed:  No discharge needs identified Readmission within the last 30 days:  No Current discharge risk:  None Barriers to Discharge:  No Barriers Identified   [place] intern  [phone]

## 2015-02-20 NOTE — Progress Notes (Signed)
Patient will DC to: [facility] Anticipated DC date: 02/20/15 Family notified: [facility] by: Son will transport by car  CSW signing off.  Cedric Fishman, [place] Social Worker [phone]

## 2015-02-20 NOTE — Discharge Summary (Addendum)
Physician Discharge Summary  Anne Compton [id number] DOB: 06-11-1927 DOA: 02/14/2015  PCP: Reymundo Poll, MD  Admit date: 02/14/2015 Discharge date: 02/20/2015  Time spent: *30 minutes  Recommendations for Outpatient Follow-up:  1. Will need to check BMP in 1 week   Discharge Diagnoses:  Principal Problem:   Abdominal pain Active Problems:   Vascular dementia   Leukocytosis   UTI (urinary tract infection)   Anemia   Acute encephalopathy   Hypokalemia   Discharge Condition: Stable  Diet recommendation: Low-salt diet  Filed Weights   02/17/15 0538 02/18/15 0346 02/20/15 0624  Weight: 74.9 kg (165 lb 2 oz) 74.617 kg (164 lb 8 oz) 71.94 kg (158 lb 9.6 oz)    History of present illness:  82 year old female with medical history of hyperlipidemia, hypertension, stroke, dementia coming from a nursing home presents with abdominal pain of uncertain duration. Unfortunately, the patient is unable to provide any history secondary to her dementia and encephalopathy. This history is obtained from review of the medical record in speaking with the patient's son, [name]. At baseline, the patient is wheelchair bound, and is able to speak in full sentences although remains pleasantly confused. She is normally on a regular diet without any dysphagia restrictions. Apparently, the patient was noted to have abdominal pain on her right side and epigastric area by the nursing facility personnel. To the son's knowledge, she has not had any problems with fevers, chills, chest pain, shortness breath, vomiting, diarrhea, hematochezia, melena. Upon admission, the patient was noted to have serum creatinine 1.31 and a WBC 15.4. Initial CT of the abdomen and pelvis revealed inflammatory changes about the uncinate process of the pancreas as well as duodenal C-loop  Hospital Course:   Acute encephalopathy -Resolved -secondary to underlying infectious process as well as dehydration -Blood cultures have been  obtained, but these were obtained after the patient received ceftriaxone-->neg -urine culture--neg but with clinical improvement with pyuria--completed 5 days of antibiotic therapy -TSH--1.208 -Serum B12--322--> since this is on the low normal range, will supplement. -Ammonia--21  Abdominal pain with leukocytosis -01/20/17CT abdomen and pelvis inflammatory changes surrounding the uncinate process of the pancreas as well as duodenal C-loop; distended gallbladder with fat stranding -Abdominal ultrasound shows GB sludge, but negative for gallbladder wall thickening or pericholecystic fluid -lipase and LFTs normal -consulted GI-hold plavix for possible endoscopy--EGD shows duodenal melanosis, 4 cm hiatal hernia -02/18/15 EGD-->duodenal melanosis, hiatus hernia, no endoscopic finding to correlate with CT inflammatory changes -Lactic acid 0.70 - Continue with Protonix 40 mg by mouth daily  Pyuria -Patient was started on ceftriaxone -urine culture--neg but with clinical improvement with pyuria--> completed 5 days of therapy with ceftriaxone. -WBC back to normal  Normocytic anemia -Baseline hemoglobin 9 -Drop in hemoglobin likely dilution -Transfused 1 unit PRBC, hemoglobin on 02/19/2015 was 9.1  Dyspnea/fluid overload -Saline lock IV fluid -02/17/15-Chest x-ray--central vascular congestion -lasix 20 IV given on 1/23 and 1/24-->improving--stable on RA -EKG sinus rhythm with nonspecific ST changes -No anginal symptoms - echocardiogram done , showed moderate LVH. -Patient now back to baseline  Hypertension -Continue labetalol, Maxzide    History of stroke  -Continue Plavix  Dementia with behavioral disturbance -Continue Aricept  -Continue Seroquel at bedtime   Hyperlipidemia  -Continue statin   Hypokalemia  - Today potassium 3.2, will replace potassium with 10 mg IV KCl 3 - We will recheck potassium before discharge- recheck potassium today is  3.9  Procedures:  EGD  Consultations:  Gastroenterology  Discharge Exam: Filed Vitals:  02/19/15 2300 02/20/15 0624  BP:  148/64  Pulse:  69  Temp: 98.4 F (36.9 C) 97.5 F (36.4 C)  Resp:  18    General: Appears in no acute distress* Cardiovascular: S1-S2 regular Respiratory: Clear to auscultation bilaterally Abdomen- soft, nontender, no organomegaly  Discharge Instructions   Discharge Instructions    Diet - low sodium heart healthy    Complete by:  As directed      Increase activity slowly    Complete by:  As directed           Current Discharge Medication List    START taking these medications   Details  pantoprazole (PROTONIX) 40 MG tablet Take 1 tablet (40 mg total) by mouth 2 (two) times daily. Qty: 60 tablet, Refills: 2    vitamin B-12 (CYANOCOBALAMIN) 250 MCG tablet Take 1 tablet (250 mcg total) by mouth daily. Qty: 30 tablet, Refills: 2      CONTINUE these medications which have CHANGED   Details  LORazepam (ATIVAN) 0.5 MG tablet Take 1 tablet (0.5 mg total) by mouth 2 (two) times daily as needed for anxiety. Qty: 30 tablet, Refills: 0    traMADol (ULTRAM) 50 MG tablet Take 1 tablet (50 mg total) by mouth 2 (two) times daily. For pain Qty: 30 tablet, Refills: 0      CONTINUE these medications which have NOT CHANGED   Details  acetaminophen (TYLENOL) 325 MG tablet Take 650 mg by mouth 2 (two) times daily. As needed for pain      atorvastatin (LIPITOR) 10 MG tablet Take 10 mg by mouth at bedtime.     Cholecalciferol 1000 UNITS tablet Take 1,000 Units by mouth every morning.     citalopram (CELEXA) 10 MG tablet Take 10 mg by mouth daily.    clopidogrel (PLAVIX) 75 MG tablet Take 75 mg by mouth daily.      donepezil (ARICEPT) 10 MG tablet Take 10 mg by mouth at bedtime.    ferrous sulfate 325 (65 FE) MG tablet Take 325 mg by mouth daily with breakfast.      fluticasone (FLONASE) 50 MCG/ACT nasal spray Place 2 sprays into both nostrils  daily.     guaifenesin (ROBITUSSIN) 100 MG/5ML syrup Take 200 mg by mouth every 6 (six) hours as needed for cough.    HYDRALAZINE HCL PO Take 50 mg by mouth 3 (three) times daily.     labetalol (NORMODYNE) 200 MG tablet Take 100 mg by mouth 3 (three) times daily.     losartan (COZAAR) 100 MG tablet Take 100 mg by mouth daily.      Multiple Vitamin (MULTIVITAMIN) tablet Take 1 tablet by mouth daily.    !! QUEtiapine (SEROQUEL) 25 MG tablet Take 12.5 mg by mouth at bedtime.    !! QUEtiapine (SEROQUEL) 50 MG tablet Take 50 mg by mouth at bedtime.    ranitidine (ZANTAC) 75 MG tablet Take 75 mg by mouth daily.     triamterene-hydrochlorothiazide (MAXZIDE-25) 37.5-25 MG per tablet Take 1 tablet by mouth daily.      !! - Potential duplicate medications found. Please discuss with provider.    STOP taking these medications     feeding supplement (ENSURE IMMUNE HEALTH) LIQD        Allergies  Allergen Reactions  . Penicillins Other (See Comments)    unknown      The results of significant diagnostics from this hospitalization (including imaging, microbiology, ancillary and laboratory) are listed below for reference.  Significant Diagnostic Studies: US Abdomen Complete  02/15/2015  CLINICAL DATA:  82 year old female with abdominal pain EXAM: ABDOMEN ULTRASOUND COMPLETE COMPARISON:  CT dated 02/14/2015 FINDINGS: Gallbladder: There is a sludge within the gallbladder. No gallbladder wall thickening or pericholecystic fluid. Negative sonographic Murphy's sign. Common bile duct: Diameter: 6 mm Liver: Trace free fluid noted around the liver. The liver is otherwise unremarkable. IVC: No abnormality visualized. Pancreas: Not well visualized. Spleen: Size and appearance within normal limits. Right Kidney: Length: 10 cm the right kidney appears echogenic. No hydronephrosis. Left Kidney: Length: 8 cm. The left kidney also appears mildly echogenic. No hydronephrosis. Abdominal aorta: The aorta  measures up to 2.5 cm on limited evaluation. Other findings: None. IMPRESSION: Gallbladder sludge without sonographic evidence of acute cholecystitis. No hydronephrosis. Electronically Signed   By: Anner Crete M.D.   On: 02/15/2015 04:57   Ct Abdomen Pelvis W Contrast  02/15/2015  ADDENDUM REPORT: 02/15/2015 04:59 ADDENDUM: There is diverticulosis of the colon with diverticula seen at the hepatic flexure. Small amount of inflammatory fluid surrounding the diverticula may be extension from the inflammatory changes of the duodenum. Diverticulitis is less likely but not excluded. Clinical correlation is recommended. Electronically Signed   By: Anner Crete M.D.   On: 02/15/2015 04:59  02/15/2015  CLINICAL DATA:  82 year old female with abdominal pain. EXAM: CT ABDOMEN AND PELVIS WITH CONTRAST TECHNIQUE: Multidetector CT imaging of the abdomen and pelvis was performed using the standard protocol following bolus administration of intravenous contrast. CONTRAST:  29mL OMNIPAQUE IOHEXOL 300 MG/ML  SOLN COMPARISON:  CT dated 02/08/2013 FINDINGS: Evaluation of the study is limited due to streak artifact caused by patient's arms. There is minimal bibasilar dependent atelectatic changes. Small left posterior diaphragmatic defect with herniation of small amount of abdominal fat compatible with a Bochdalek hernia. No intra-abdominal free air or free fluid. The gallbladder is distended. There is stranding in the fat surrounding the gallbladder and within the forehead status. No definite calcified stone identified within the gallbladder. There is an ill-defined 7 mm high attenuating focus (series 3, image 25 and coronal series 6, image 45) which may represent volume averaging artifact with adjacent vessel or stone within the neck of the gallbladder or in the cystic duct. Ultrasound is recommended for better evaluation of the gallbladder. There is inflammatory changes of the and uncinate process of the pancreas as well  as inflammatory changes of the duodenal C-loop may represent pancreatitis versus duodenitis. Correlation with pancreatic enzymes recommended. No drainable fluid collection/abscess identified. The spleen, adrenal glands appear unremarkable. There is mild bilateral renal atrophy. There is no hydronephrosis on either side. The visualized ureters appear unremarkable. The urinary bladder is collapsed. There is diffuse thickening of the bladder wall. Cystitis or an infiltrative process is not excluded. Correlation with urinalysis and urine cytology and further evaluation with cystoscopy if clinically indicated recommended. There is calcification of the uterus compatible with fibroids. There is extensive sigmoid diverticulosis without active inflammatory changes. There is no evidence of bowel obstruction. A moderate size hiatal hernia is seen. There is inflammatory changes of the distal stomach and duodenal C-loop as described may represent gastroenteritis or be related to pancreatitis. A 2.0 x 1.7 cm nodular soft tissue density in the region of the duodenal bulb may represent oral content within the lumen. The soft tissue lesion is not excluded. Upper GI study or endoscopy is recommended for better evaluation. There is aortoiliac atherosclerotic disease. The abdominal aorta and IVC appear patent. No portal venous gas  identified. There is no adenopathy. There is a small fat containing umbilical hernia. The abdominal wall soft tissues are grossly unremarkable. There is degenerative changes of the spine. No acute fracture. IMPRESSION: Inflammatory changes of the porta hepaticus, surrounding the head and uncinate process of the pancreas and duodenal C-loop. Findings may represent pancreatitis or gastroduodenitis. Clinical correlation is recommended. Ill-defined high attenuating area along the course of the cystic duct may be volume averaging artifact with the adjacent vessels or represent stone within the cystic duct.  Ultrasound is recommended for better evaluation of the gallbladder. Soft tissue appearing structure within the region of the duodenal bulb may represent oral content, or luminal fold. The soft tissue lesion is not excluded. Endoscopy or upper GI study may provide better evaluation. Diffuse thickening of the bladder wall may represent cystitis or an infiltrative process. Correlation with urinalysis and further evaluation with endoscopy if clinically indicated recommended. Sigmoid diverticulosis.  No evidence of bowel obstruction. Electronically Signed: By: Anner Crete M.D. On: 02/14/2015 22:01   Dg Chest Port 1 View  02/17/2015  CLINICAL DATA:  Dyspnea EXAM: PORTABLE CHEST 1 VIEW COMPARISON:  Radiograph 02/14/2015 FINDINGS: Normal cardiac silhouette. Low lung volumes. Mild central venous congestion. No focal consolidation. Small LEFT effusion. No pneumothorax. IMPRESSION: Low lung volumes with central venous congestion. Small LEFT effusion. Electronically Signed   By: Suzy Bouchard M.D.   On: 02/17/2015 15:46   Dg Chest Port 1 View  02/14/2015  CLINICAL DATA:  Right lower quadrant pain, dementia EXAM: PORTABLE CHEST 1 VIEW COMPARISON:  11/03/2014 FINDINGS: Cardiomediastinal silhouette is stable. No acute infiltrate or pleural effusion. No pulmonary edema. Elevation of the right hemidiaphragm again noted. IMPRESSION: No active disease. Elevation of the right hemidiaphragm again noted. Electronically Signed   By: Lahoma Crocker M.D.   On: 02/14/2015 23:21    Microbiology: Recent Results (from the past 240 hour(s))  Urine culture     Status: None   Collection Time: 02/14/15  8:20 PM  Result Value Ref Range Status   Specimen Description URINE, CATHETERIZED  Final   Special Requests NONE  Final   Culture   Final    NO GROWTH 2 DAYS Performed at [facility]    Report Status 02/16/2015 FINAL  Final  MRSA PCR Screening     Status: Abnormal   Collection Time: 02/15/15  2:16 AM  Result Value  Ref Range Status   MRSA by PCR POSITIVE (A) NEGATIVE Final    Comment:        The GeneXpert MRSA Assay (FDA approved for NASAL specimens only), is one component of a comprehensive MRSA colonization surveillance program. It is not intended to diagnose MRSA infection nor to guide or monitor treatment for MRSA infections. RESULT CALLED TO, READ BACK BY AND VERIFIED WITH: C DULIAO,RN @0518  02/15/15 MKELLY   Culture, blood (routine x 2)     Status: None (Preliminary result)   Collection Time: 02/15/15 10:30 AM  Result Value Ref Range Status   Specimen Description BLOOD LEFT ANTECUBITAL  Final   Special Requests BOTTLES DRAWN AEROBIC ONLY [place]  Final   Culture NO GROWTH 4 DAYS  Final   Report Status PENDING  Incomplete  Culture, blood (routine x 2)     Status: None (Preliminary result)   Collection Time: 02/15/15 10:35 AM  Result Value Ref Range Status   Specimen Description BLOOD BLOOD LEFT HAND  Final   Special Requests IN PEDIATRIC BOTTLE 3CC  Final   Culture NO  GROWTH 4 DAYS  Final   Report Status PENDING  Incomplete     Labs: Basic Metabolic Panel:  Recent Labs Lab 02/16/15 0522 02/17/15 0413 02/18/15 0622 02/19/15 0350 02/20/15 0411  NA 144 145 147* 142 141  K 3.4* 3.7 3.4* 3.4* 3.2*  CL 110 112* 109 102 103  CO2 28 25 27 29 29   GLUCOSE 136* 122* 136* 126* 108*  BUN 15 14 13 14 14   CREATININE 1.26* 1.08* 0.98 1.00 1.00  CALCIUM 8.5* 8.1* 8.6* 8.7* 8.3*  MG  --  2.0  --   --   --    Liver Function Tests:  Recent Labs Lab 02/14/15 2030  AST 26  ALT 14  ALKPHOS 56  BILITOT 0.7  PROT 7.2  ALBUMIN 3.5    Recent Labs Lab 02/14/15 2030  LIPASE 17    Recent Labs Lab 02/17/15 0412  AMMONIA 21   CBC:  Recent Labs Lab 02/14/15 2030  02/16/15 0522 02/16/15 1430 02/17/15 0413 02/18/15 0622 02/19/15 0350  WBC 15.4*  < > 13.6* 13.8* 12.4* 11.8* 9.6  NEUTROABS 13.8*  --   --   --   --   --   --   HGB 9.4*  < > 7.6* 7.5* 7.0* 8.8* 9.1*  HCT 30.5*   < > 23.9* 23.4* 22.5* 28.3* 29.7*  MCV 85.7  < > 85.1 86.0 85.6 85.5 85.3  PLT 307  < > 239 242 231 265 275  < > = values in this interval not displayed. Cardiac Enzymes:  Recent Labs Lab 02/14/15 2031  TROPONINI 0.03   BNP:     Signed:  [name] MD.  [facility] 02/20/2015, 12:33 PM

## 2015-02-20 NOTE — Evaluation (Signed)
Physical Therapy Evaluation/ Discharge Patient Details Name: Anne Compton MRN: [id number] DOB: [birth year] Today's Date: 02/20/2015   History of Present Illness  82 y.o. Female with past medical history of vascular dementia, hypertension; who presented to [facility] emergency department with reported complaints of abdominal pain from [place] [phone]) facility. Pt with UTI, pancreatitis and encephalopathy  Clinical Impression  Anne Compton is pleasantly confused but able to answer some questions. She was oriented to self but not situation or year. Pt states she walks but contacted staff at facility who states she requires assist for all mobility and at best can transfer to a chair. Pt is currently at functional baseline with recommendation for return to SNF. Recommend OOB daily with nursing staff but no further therapy needs at this time. Pt with generalized weakness and impaired cognition. Recommend +2 squat pivot or hoyer lift acutely.     Follow Up Recommendations No PT follow up;SNF    Equipment Recommendations  None recommended by PT    Recommendations for Other Services       Precautions / Restrictions Precautions Precautions: Fall      Mobility  Bed Mobility Overal bed mobility: Needs Assistance Bed Mobility: Rolling;Sidelying to Sit Rolling: Min assist Sidelying to sit: Max assist       General bed mobility comments: cues for sequence with assist to move legs to EOB, elevate trunk and total assist to scoot to EOB  Transfers Overall transfer level: Needs assistance   Transfers: Sit to/from Stand;Squat Pivot Transfers Sit to Stand: Max assist   Squat pivot transfers: Max assist     General transfer comment: Attempted sit to stand x 4 with limited success first 2 trials. Squat pivot with use of belt and cues for pt with bil knees blocked from bed to recliner max assist. Max assist to scoot back in chair  with pad  Ambulation/Gait                Stairs            Wheelchair Mobility    Modified Rankin (Stroke Patients Only)       Balance Overall balance assessment: Needs assistance   Sitting balance-Leahy Scale: Fair       Standing balance-Leahy Scale: Poor                               Pertinent Vitals/Pain Pain Assessment: No/denies pain    Home Living Family/patient expects to be discharged to:: Skilled nursing facility                      Prior Function Level of [place]: Needs assistance   Gait / Transfers Assistance Needed: pt is max to total assist of 1-2 people for stand pivots depending on her day. Min - max assist for bed mobility  ADL's / Homemaking Assistance Needed: total care for all ADLs        Hand Dominance        Extremity/Trunk Assessment   Upper Extremity Assessment: Generalized weakness           Lower Extremity Assessment: Generalized weakness      Cervical / Trunk Assessment: Kyphotic  Communication   Communication: No difficulties  Cognition Arousal/Alertness: Awake/alert Behavior During Therapy: Flat affect Overall Cognitive Status: No family/caregiver present to determine baseline cognitive functioning  General Comments      Exercises        Assessment/Plan    PT Assessment All further PT needs can be met in the next venue of care  PT Diagnosis Difficulty walking;Generalized weakness;Altered mental status   PT Problem List Decreased strength;Decreased activity tolerance;Decreased balance;Decreased safety awareness;Decreased mobility  PT Treatment Interventions     PT Goals (Current goals can be found in the Care Plan section) Acute Rehab PT Goals PT Goal Formulation: All assessment and education complete, DC therapy    Frequency     Barriers to discharge        Co-evaluation               End of Session Equipment Utilized During  Treatment: Gait belt Activity Tolerance: Patient tolerated treatment well Patient left: in chair;with call bell/phone within reach;with chair alarm set Nurse Communication: Mobility status;Precautions         Time: 1761-6073 PT Time Calculation (min) (ACUTE ONLY): 21 min   Charges:   PT Evaluation $PT Eval Moderate Complexity: 1 Procedure     PT G CodesMelford Aase 02/20/2015, 10:51 AM Elwyn Reach, [place]

## 2015-02-20 NOTE — Progress Notes (Signed)
Patient's IV removed. Discharge paperwork given to patient's soon. No questions verbalized. Patient assisted with getting dressed.

## 2015-02-20 NOTE — NC FL2 (Signed)
[place] MEDICAID FL2 LEVEL OF CARE SCREENING TOOL     IDENTIFICATION  Patient Name: Anne Compton Birthdate: 25-May-1927 Sex: female Admission Date (Current Location): 02/14/2015  [facility] and [place] Number:  Herbalist and Address:  [facility] [place], [place] [address] [place], [place] [address]      Provider Number: [id number]  Attending Physician Name and Address:  [name], MD  Relative Name and Phone Number:   Malaiah Shakur [facility]) [phone])    Current Level of Care: Hospital Recommended Level of Care: ALF Prior Approval Number: 03-27-2009  Date Approved/Denied: 03/27/09 PASRR Number: [id number]  Discharge Plan: ALF    Current Diagnoses: Patient Active Problem List   Diagnosis Date Noted  . Leukocytosis 02/15/2015  . UTI (urinary tract infection) 02/15/2015  . Anemia 02/15/2015  . Acute encephalopathy 02/15/2015  . Hypokalemia 02/15/2015  . Abdominal pain 02/14/2015  . Status post nail surgery 11/04/2014  . Ingrown nail 10/28/2014  . Paronychia 10/28/2014  . Onychomycosis 10/28/2014  . Vascular dementia     Orientation RESPIRATION BLADDER Height & Weight    Self  Normal Continent 5\' 3"  (160 cm) 158 lbs.  BEHAVIORAL SYMPTOMS/MOOD NEUROLOGICAL BOWEL NUTRITION STATUS      Continent Diet (regular)  AMBULATORY STATUS COMMUNICATION OF NEEDS Skin   Limited Assist Verbally Normal                       Personal Care Assistance Level of Assistance  Bathing, Feeding, Dressing Bathing Assistance: Limited assistance Feeding assistance: Limited assistance Dressing Assistance: Limited assistance     Functional Limitations Info  Sight, Hearing, Speech Sight Info: Impaired Hearing Info: Impaired Speech Info: Impaired    SPECIAL CARE FACTORS FREQUENCY  PT (By licensed PT), OT (By licensed OT)     PT Frequency: To be assessed  OT Frequency: To be assessed             Contractures      Additional  Factors Info  Code Status, Allergies Code Status Info: FULL Allergies Info: Penicillins           Current Medications (02/20/2015):    Discharge Medications: START taking these medications   Details  pantoprazole (PROTONIX) 40 MG tablet Take 1 tablet (40 mg total) by mouth 2 (two) times daily. Qty: 60 tablet, Refills: 2    vitamin B-12 (CYANOCOBALAMIN) 250 MCG tablet Take 1 tablet (250 mcg total) by mouth daily. Qty: 30 tablet, Refills: 2      CONTINUE these medications which have CHANGED   Details  LORazepam (ATIVAN) 0.5 MG tablet Take 1 tablet (0.5 mg total) by mouth 2 (two) times daily as needed for anxiety. Qty: 30 tablet, Refills: 0    traMADol (ULTRAM) 50 MG tablet Take 1 tablet (50 mg total) by mouth 2 (two) times daily. For pain Qty: 30 tablet, Refills: 0      CONTINUE these medications which have NOT CHANGED   Details  acetaminophen (TYLENOL) 325 MG tablet Take 650 mg by mouth 2 (two) times daily. As needed for pain      atorvastatin (LIPITOR) 10 MG tablet Take 10 mg by mouth at bedtime.     Cholecalciferol 1000 UNITS tablet Take 1,000 Units by mouth every morning.     citalopram (CELEXA) 10 MG tablet Take 10 mg by mouth daily.    clopidogrel (PLAVIX) 75 MG tablet Take 75 mg by mouth daily.     donepezil (ARICEPT)  10 MG tablet Take 10 mg by mouth at bedtime.    ferrous sulfate 325 (65 FE) MG tablet Take 325 mg by mouth daily with breakfast.     fluticasone (FLONASE) 50 MCG/ACT nasal spray Place 2 sprays into both nostrils daily.     guaifenesin (ROBITUSSIN) 100 MG/5ML syrup Take 200 mg by mouth every 6 (six) hours as needed for cough.    HYDRALAZINE HCL PO Take 50 mg by mouth 3 (three) times daily.     labetalol (NORMODYNE) 200 MG tablet Take 100 mg by mouth 3 (three) times daily.     losartan (COZAAR) 100 MG tablet Take 100 mg by mouth daily.     Multiple Vitamin (MULTIVITAMIN) tablet Take  1 tablet by mouth daily.    !! QUEtiapine (SEROQUEL) 25 MG tablet Take 12.5 mg by mouth at bedtime.    !! QUEtiapine (SEROQUEL) 50 MG tablet Take 50 mg by mouth at bedtime.    ranitidine (ZANTAC) 75 MG tablet Take 75 mg by mouth daily.     triamterene-hydrochlorothiazide (MAXZIDE-25) 37.5-25 MG per tablet Take 1 tablet by mouth daily.     !! - Potential duplicate medications found. Please discuss with provider.    STOP taking these medications     feeding supplement (ENSURE IMMUNE HEALTH) LIQD        Allergies  Allergen Reactions  . Penicillins Other (See Comments)         Relevant Imaging Results:  Relevant Lab Results:   Additional Information SS#: [id number]  Benard Halsted, LCSWA

## 2015-11-10 ENCOUNTER — Emergency Department (HOSPITAL_COMMUNITY)
Admission: EM | Admit: 2015-11-10 | Discharge: 2015-11-10 | Disposition: A | Discharge status: Home / Self Care | Payer: Medicare Other | Attending: Emergency Medicine | Admitting: Emergency Medicine

## 2015-11-10 ENCOUNTER — Emergency Department (HOSPITAL_COMMUNITY): Payer: Medicare Other

## 2015-11-10 ENCOUNTER — Encounter (HOSPITAL_COMMUNITY): Payer: Self-pay | Admitting: Emergency Medicine

## 2015-11-10 DIAGNOSIS — Z7951 Long term (current) use of inhaled steroids: Secondary | ICD-10-CM | POA: Diagnosis not present

## 2015-11-10 DIAGNOSIS — I10 Essential (primary) hypertension: Secondary | ICD-10-CM | POA: Insufficient documentation

## 2015-11-10 DIAGNOSIS — Z79899 Other long term (current) drug therapy: Secondary | ICD-10-CM | POA: Diagnosis not present

## 2015-11-10 DIAGNOSIS — Y939 Activity, unspecified: Secondary | ICD-10-CM | POA: Insufficient documentation

## 2015-11-10 DIAGNOSIS — S161XXA Strain of muscle, fascia and tendon at neck level, initial encounter: Secondary | ICD-10-CM

## 2015-11-10 DIAGNOSIS — Y929 Unspecified place or not applicable: Secondary | ICD-10-CM | POA: Diagnosis not present

## 2015-11-10 DIAGNOSIS — Z853 Personal history of malignant neoplasm of breast: Secondary | ICD-10-CM | POA: Diagnosis not present

## 2015-11-10 DIAGNOSIS — Z8659 Personal history of other mental and behavioral disorders: Secondary | ICD-10-CM

## 2015-11-10 DIAGNOSIS — S0990XA Unspecified injury of head, initial encounter: Secondary | ICD-10-CM | POA: Diagnosis present

## 2015-11-10 DIAGNOSIS — Y999 Unspecified external cause status: Secondary | ICD-10-CM | POA: Insufficient documentation

## 2015-11-10 DIAGNOSIS — W19XXXA Unspecified fall, initial encounter: Secondary | ICD-10-CM | POA: Diagnosis not present

## 2015-11-10 NOTE — ED Triage Notes (Signed)
At 15 after midnight pt was found on floor at her facility [facility]. Pt had a unwitnessed fall, ems report no  and edema, abrasions, no signs of injury.  Pt is at baseline dementia, pt is on plavix currently,   Allergies to penicillin.   V/s cbg 122 , bp 158/87, pulse 70 reg, rr16, spo2 98 room air.

## 2015-11-10 NOTE — ED Notes (Signed)
Patient is alert and oriented to baseline.  Her son was given DC instructions and follow up visit instructions.  Patient gave verbal understanding. She was DC via stretcher under her own power to home.  V/S stable.  He was not showing any signs of distress on DC

## 2015-11-10 NOTE — ED Notes (Signed)
Bed: WA07 Expected date:  Expected time:  Means of arrival:  Comments: 

## 2015-11-10 NOTE — ED Provider Notes (Signed)
[facility] DEPT Provider Note   CSN: [id number] Arrival date & time: 11/10/15  0133  By signing my name below, I, [name], attest that this documentation has been prepared under the direction and in the presence of [name], MD . Electronically Signed: [name], Scribe. 11/10/2015. 1:42 AM.  History   Chief Complaint Chief Complaint  Patient presents with  . Fall    on blood thinner ( dementia at baseline )    The history is provided by the patient. No language interpreter was used.   HPI Comments: Anne Compton is a 82 y.o. female brought in by EMS to the Emergency Department from [facility] for an evaluation s/p an unwitnessed fall. Pt does not remember falling; however, she does complain of chest and neck pain. She states her neck pain is exacerbated when moving her head to the left and right. She is currently taking Plavix. She denies hip pain and abdominal pain.  Past Medical History:  Diagnosis Date  . Breast cancer ([place])   . Cancer ([place])   . Hypertension   . Stroke [facility])    TIA  . Transient cerebrovascular ischemia   . Vascular dementia     Patient Active Problem List   Diagnosis Date Noted  . Leukocytosis 02/15/2015  . UTI (urinary tract infection) 02/15/2015  . Anemia 02/15/2015  . Acute encephalopathy 02/15/2015  . Hypokalemia 02/15/2015  . Abdominal pain 02/14/2015  . Status post nail surgery 11/04/2014  . Ingrown nail 10/28/2014  . Paronychia 10/28/2014  . Onychomycosis 10/28/2014  . Vascular dementia     Past Surgical History:  Procedure Laterality Date  . ESOPHAGOGASTRODUODENOSCOPY Left 02/18/2015   Procedure: ESOPHAGOGASTRODUODENOSCOPY (EGD);  Surgeon: Carol Ada, MD;  Location[facility] ENDOSCOPY;  Service: Endoscopy;  Laterality: Left;  . JOINT REPLACEMENT      OB History    No data available       Home Medications    Prior to Admission medications   Medication Sig Start Date End Date Taking? Authorizing Provider    acetaminophen (TYLENOL) 325 MG tablet Take 650 mg by mouth 2 (two) times daily. As needed for pain      Historical Provider, MD  atorvastatin (LIPITOR) 10 MG tablet Take 10 mg by mouth at bedtime.     Historical Provider, MD  Cholecalciferol 1000 UNITS tablet Take 1,000 Units by mouth every morning.     Historical Provider, MD  citalopram (CELEXA) 10 MG tablet Take 10 mg by mouth daily.    Historical Provider, MD  clopidogrel (PLAVIX) 75 MG tablet Take 75 mg by mouth daily.      Historical Provider, MD  donepezil (ARICEPT) 10 MG tablet Take 10 mg by mouth at bedtime.    Historical Provider, MD  ferrous sulfate 325 (65 FE) MG tablet Take 325 mg by mouth daily with breakfast.      Historical Provider, MD  fluticasone (FLONASE) 50 MCG/ACT nasal spray Place 2 sprays into both nostrils daily.     Historical Provider, MD  guaifenesin (ROBITUSSIN) 100 MG/5ML syrup Take 200 mg by mouth every 6 (six) hours as needed for cough.    Historical Provider, MD  HYDRALAZINE HCL PO Take 50 mg by mouth 3 (three) times daily.     Historical Provider, MD  labetalol (NORMODYNE) 200 MG tablet Take 100 mg by mouth 3 (three) times daily.     Historical Provider, MD  LORazepam (ATIVAN) 0.5 MG tablet Take 1 tablet (0.5 mg total) by mouth  2 (two) times daily as needed for anxiety. 02/20/15   [name], MD  losartan (COZAAR) 100 MG tablet Take 100 mg by mouth daily.      Historical Provider, MD  Multiple Vitamin (MULTIVITAMIN) tablet Take 1 tablet by mouth daily.    Historical Provider, MD  pantoprazole (PROTONIX) 40 MG tablet Take 1 tablet (40 mg total) by mouth daily. 02/20/15   [name], MD  QUEtiapine (SEROQUEL) 25 MG tablet Take 12.5 mg by mouth at bedtime.    Historical Provider, MD  QUEtiapine (SEROQUEL) 50 MG tablet Take 50 mg by mouth at bedtime.    Historical Provider, MD  ranitidine (ZANTAC) 75 MG tablet Take 75 mg by mouth daily.     Historical Provider, MD  traMADol (ULTRAM) 50 MG tablet Take 1 tablet (50  mg total) by mouth 2 (two) times daily. For pain 02/20/15   [name], MD  triamterene-hydrochlorothiazide (MAXZIDE-25) 37.5-25 MG per tablet Take 1 tablet by mouth daily.     Historical Provider, MD  vitamin B-12 (CYANOCOBALAMIN) 250 MCG tablet Take 1 tablet (250 mcg total) by mouth daily. 02/20/15   [name], MD    Family History No family history on file.  Social History Social History  Substance Use Topics  . Smoking status: Unknown If Ever Smoked  . Smokeless tobacco: Not on file  . Alcohol use No     Comment: pt lives in LTCF     Allergies   Penicillins   Review of Systems Review of Systems  Cardiovascular: Positive for chest pain.  Gastrointestinal: Negative for abdominal pain.  Musculoskeletal: Positive for neck pain. Negative for arthralgias.  All other systems reviewed and are negative.  Physical Exam Updated Vital Signs BP 173/82 (BP Location: Left Arm)   Pulse 78   Temp 98.2 F (36.8 C) (Oral)   Resp 17   SpO2 99%   Physical Exam  Constitutional: She is oriented to person, place, and time. She appears well-developed and well-nourished. No distress.  Pt is confused at baseline.  HENT:  Head: Normocephalic and atraumatic.  Eyes: EOM are normal. Pupils are equal, round, and reactive to light.  Neck: Normal range of motion.  Cardiovascular: Normal rate, regular rhythm and normal heart sounds.   Pulmonary/Chest: Effort normal and breath sounds normal.  Abdominal: Soft. She exhibits no distension. There is no tenderness.  Musculoskeletal: Normal range of motion.  No TTP of the cervical, thoracic, or lumbar region. Good ROM of both hips. No obvious deformity of any extremity.   Neurological: She is alert and oriented to person, place, and time.  Pt moves all 4 extremities. She is demented; however, responds appropriately to commands and questions.   Skin: Skin is warm and dry.  Psychiatric: She has a normal mood and affect. Judgment normal.  Nursing  note and vitals reviewed.  ED Treatments / Results  Labs (all labs ordered are listed, but only abnormal results are displayed) Labs Reviewed - No data to display  EKG  EKG Interpretation None       Radiology No results found.  Procedures Procedures (including critical care time)  Medications Ordered in ED Medications - No data to display  DIAGNOSTIC STUDIES:  Oxygen Saturation is 99% on RA, normal by my interpretation.    COORDINATION OF CARE:  1:38 AM Discussed treatment plan with pt at bedside and pt agreed to plan.  Initial Impression / Assessment and Plan / ED Course  I have reviewed  the triage vital signs and the nursing notes.  Pertinent labs & imaging results that were available during my care of the patient were reviewed by me and considered in my medical decision making (see chart for details).  Clinical Course    Patient brought for evaluation of a fall that occurred at the extended care facility where the patient resides. She is demented and difficult to assess neurologically. A head CT was obtained which was negative. Cervical spine CT was also negative. She has no other overt signs of trauma and otherwise appears well. I believe she is appropriate for return to the ECF.  I personally performed the services described in this documentation, which was scribed in my presence. The recorded information has been reviewed and is accurate.   Final Clinical Impressions(s) / ED Diagnoses   Final diagnoses:  None    New Prescriptions New Prescriptions   No medications on file     [name], MD 11/10/15 [phone]

## 2015-12-03 ENCOUNTER — Emergency Department (HOSPITAL_COMMUNITY): Payer: Medicare Other

## 2015-12-03 ENCOUNTER — Emergency Department (HOSPITAL_COMMUNITY)
Admission: EM | Admit: 2015-12-03 | Discharge: 2015-12-03 | Disposition: A | Discharge status: Home / Self Care | Payer: Medicare Other | Attending: Emergency Medicine | Admitting: Emergency Medicine

## 2015-12-03 ENCOUNTER — Encounter (HOSPITAL_COMMUNITY): Payer: Self-pay | Admitting: *Deleted

## 2015-12-03 DIAGNOSIS — F0390 Unspecified dementia without behavioral disturbance: Secondary | ICD-10-CM | POA: Diagnosis present

## 2015-12-03 DIAGNOSIS — Z853 Personal history of malignant neoplasm of breast: Secondary | ICD-10-CM | POA: Diagnosis not present

## 2015-12-03 DIAGNOSIS — Y999 Unspecified external cause status: Secondary | ICD-10-CM | POA: Insufficient documentation

## 2015-12-03 DIAGNOSIS — W19XXXA Unspecified fall, initial encounter: Secondary | ICD-10-CM | POA: Insufficient documentation

## 2015-12-03 DIAGNOSIS — N390 Urinary tract infection, site not specified: Secondary | ICD-10-CM | POA: Diagnosis not present

## 2015-12-03 DIAGNOSIS — Z79899 Other long term (current) drug therapy: Secondary | ICD-10-CM | POA: Insufficient documentation

## 2015-12-03 DIAGNOSIS — Z8673 Personal history of transient ischemic attack (TIA), and cerebral infarction without residual deficits: Secondary | ICD-10-CM | POA: Insufficient documentation

## 2015-12-03 DIAGNOSIS — Y939 Activity, unspecified: Secondary | ICD-10-CM | POA: Diagnosis not present

## 2015-12-03 DIAGNOSIS — Z9660 Presence of unspecified orthopedic joint implant: Secondary | ICD-10-CM | POA: Insufficient documentation

## 2015-12-03 DIAGNOSIS — Y92009 Unspecified place in unspecified non-institutional (private) residence as the place of occurrence of the external cause: Secondary | ICD-10-CM | POA: Insufficient documentation

## 2015-12-03 DIAGNOSIS — I10 Essential (primary) hypertension: Secondary | ICD-10-CM | POA: Insufficient documentation

## 2015-12-03 LAB — URINALYSIS, ROUTINE W REFLEX MICROSCOPIC
BILIRUBIN URINE: NEGATIVE
GLUCOSE, UA: NEGATIVE mg/dL
HGB URINE DIPSTICK: NEGATIVE
KETONES UR: NEGATIVE mg/dL
Nitrite: POSITIVE — AB
PH: 5.5 (ref 5.0–8.0)
Protein, ur: NEGATIVE mg/dL
SPECIFIC GRAVITY, URINE: 1.017 (ref 1.005–1.030)

## 2015-12-03 LAB — URINE MICROSCOPIC-ADD ON

## 2015-12-03 MED ORDER — CEPHALEXIN 500 MG PO CAPS: 500.0000 mg | ORAL_CAPSULE | Freq: Three times a day (TID) | ORAL | 0 refills | Status: AC

## 2015-12-03 MED ORDER — CEPHALEXIN 250 MG PO CAPS
500.0000 mg | ORAL_CAPSULE | Freq: Once | ORAL | Status: AC
  Administered 2015-12-03: 500 mg via ORAL
  Filled 2015-12-03: qty 2

## 2015-12-03 NOTE — ED Triage Notes (Signed)
Pt from [facility] by EMS c/o unwitnessed fall. Pt was found lying on the floor between bed and nightstand. Pt has dementia. Initially c/o L arm and shoulder pain, although denies pain at present. EMS placed a c-collar made from a rolled up towel

## 2015-12-03 NOTE — ED Notes (Signed)
Patient given water and applesauce with medication. Patient able to tolerate well.

## 2015-12-03 NOTE — ED Provider Notes (Signed)
By signing my name below, I, Emmanuella Mensah, attest that this documentation has been prepared under the direction and in the presence of Ezequiel Essex, MD. Electronically Signed: Judithann Sauger, ED Scribe. 12/03/15. 2:57 AM.   HPI Comments: Level 5 Caveat due to dementia  Anne Compton is a 82 y.o. female with a hx of vascular dementia and hypertension brought in by ambulance from [facility], who presents to the Emergency Department for evaluation s/p fall that occurred PTA. Pt is unsure of the mechanism of the fall and she denies any current pain. Per EMS, pt was found lying on the floor between her bed and nightstand. She presented to the ED with a c-collar in place made from a rolled up towel. Pt is currently on Plavix and she has an allergy to Penicillins.   ROS: Unable to perform ROS    Physical Examination: Pinpoint pupils; no C, T, or L spine tenderness; follows some commands Moving all extremities. CTAB, RRR Abdomen soft.  Pelvis stable. FROM hips without pain.  CT head and C spine.  At baseline mental status per nursing facility.   I personally performed the services described in this documentation, which was scribed in my presence. The recorded information has been reviewed and is accurate.    Ezequiel Essex, MD 12/03/15 [phone]

## 2015-12-03 NOTE — ED Provider Notes (Signed)
[facility] DEPT Provider Note   CSN: [id number] Arrival date & time: 12/03/15  0144     History   Chief Complaint Chief Complaint  Patient presents with  . Fall    HPI Anne Compton is a 82 y.o. female.  HPI  Pt presents via EMS for eval after she was found on the ground in the middle of the night lying on the ground between her bed and night stand, unwitnessed and unknown length of time lying on the ground.  She was alert and reportedly at her baseline mental status, pleasantly demented.  She Originally complained to EMS of left arm and left shoulder pain.  At the time of evaluation she has no pain complaints.  She is able to say her, but otherwise is unable to provide any history, and is not oriented to time or place. She states that she is at her sister's house.  She has no recollection of falling onto the ground and she repeatedly has no pain complaints.   Remaining history is obtained from EMS reports and family members.  The family was contacted by the nursing home and they requested an evaluation specifically at her facility.  EMS transported her to the ER and have placed a towel around her neck, there was no reported neck pain complaints.   Patient does have a history of hypertension, stroke and vascular dementia.    LEVEL 5 CAVEAT - dementia  Past Medical History:  Diagnosis Date  . Breast cancer ([place])   . Cancer ([place])   . Hypertension   . Stroke [facility])    TIA  . Transient cerebrovascular ischemia   . Vascular dementia     Patient Active Problem List   Diagnosis Date Noted  . Leukocytosis 02/15/2015  . UTI (urinary tract infection) 02/15/2015  . Anemia 02/15/2015  . Acute encephalopathy 02/15/2015  . Hypokalemia 02/15/2015  . Abdominal pain 02/14/2015  . Status post nail surgery 11/04/2014  . Ingrown nail 10/28/2014  . Paronychia 10/28/2014  . Onychomycosis 10/28/2014  . Vascular dementia     Past Surgical History:  Procedure Laterality Date  .  ESOPHAGOGASTRODUODENOSCOPY Left 02/18/2015   Procedure: ESOPHAGOGASTRODUODENOSCOPY (EGD);  Surgeon: Carol Ada, MD;  Location: [facility] ENDOSCOPY;  Service: Endoscopy;  Laterality: Left;  . JOINT REPLACEMENT      OB History    No data available       Home Medications    Prior to Admission medications   Medication Sig Start Date End Date Taking? Authorizing Provider  Cholecalciferol 1000 UNITS tablet Take 1,000 Units by mouth every morning.    Yes Historical Provider, MD  citalopram (CELEXA) 10 MG tablet Take 10 mg by mouth daily.   Yes Historical Provider, MD  clopidogrel (PLAVIX) 75 MG tablet Take 75 mg by mouth daily.     Yes Historical Provider, MD  donepezil (ARICEPT) 10 MG tablet Take 10 mg by mouth at bedtime.   Yes Historical Provider, MD  ferrous sulfate 325 (65 FE) MG tablet Take 325 mg by mouth 3 (three) times daily with meals.    Yes Historical Provider, MD  fluticasone (FLONASE) 50 MCG/ACT nasal spray Place 2 sprays into both nostrils daily.    Yes Historical Provider, MD  hydrALAZINE (APRESOLINE) 50 MG tablet Take 50 mg by mouth 3 (three) times daily.   Yes Historical Provider, MD  labetalol (NORMODYNE) 100 MG tablet Take 100 mg by mouth 3 (three) times daily.   Yes Historical Provider, MD  losartan (COZAAR)  50 MG tablet Take 50 mg by mouth daily.   Yes Historical Provider, MD  Multiple Vitamin (MULTIVITAMIN) tablet Take 1 tablet by mouth daily.   Yes Historical Provider, MD  pantoprazole (PROTONIX) 20 MG tablet Take 20 mg by mouth daily.   Yes Historical Provider, MD  QUEtiapine (SEROQUEL) 25 MG tablet Take 12.5 mg by mouth at bedtime.   Yes Historical Provider, MD  ranitidine (ZANTAC) 75 MG tablet Take 75 mg by mouth daily.    Yes Historical Provider, MD  traMADol (ULTRAM) 50 MG tablet Take 1 tablet (50 mg total) by mouth 2 (two) times daily. For pain 02/20/15  Yes [name], MD  triamterene-hydrochlorothiazide (DYAZIDE) 37.5-25 MG capsule Take 1 capsule by mouth daily.   Yes  Historical Provider, MD  cephALEXin (KEFLEX) 500 MG capsule Take 1 capsule (500 mg total) by mouth 3 (three) times daily. 12/03/15 12/10/15  Delsa Grana, PA-C  LORazepam (ATIVAN) 0.5 MG tablet Take 1 tablet (0.5 mg total) by mouth 2 (two) times daily as needed for anxiety. Patient not taking: Reported on 12/03/2015 02/20/15   [name], MD  pantoprazole (PROTONIX) 40 MG tablet Take 1 tablet (40 mg total) by mouth daily. Patient not taking: Reported on 12/03/2015 02/20/15   [name], MD  vitamin B-12 (CYANOCOBALAMIN) 250 MCG tablet Take 1 tablet (250 mcg total) by mouth daily. Patient not taking: Reported on 12/03/2015 02/20/15   [name], MD    Family History No family history on file.  Social History Social History  Substance Use Topics  . Smoking status: Unknown If Ever Smoked  . Smokeless tobacco: Not on file  . Alcohol use No     Comment: pt lives in LTCF     Allergies   Penicillins   Review of Systems Review of Systems  Unable to perform ROS: Dementia     Physical Exam Updated Vital Signs BP 139/77   Pulse 70   Temp 98.3 F (36.8 C) (Oral)   Resp 16   SpO2 97%   Physical Exam  Constitutional: She appears well-developed and well-nourished. No distress.  HENT:  Head: Normocephalic and atraumatic.  Right Ear: External ear normal.  Left Ear: External ear normal.  Nose: Nose normal.  Mouth/Throat: Oropharynx is clear and moist. No oropharyngeal exudate.  No scalp or facial contusions or hematomas, no tenderness to palpation   Eyes: Conjunctivae are normal. Right eye exhibits no discharge. Left eye exhibits no discharge. No scleral icterus.  1-2 mm pupils, symmetrical, normal tracking  Neck:  Pt with "c-collar" made of large towel wrapped around her neck, she is able to sit up in ER bed and move neck w/o pain, no midline tenderness or step-off.  Cardiovascular: Normal rate, regular rhythm, normal heart sounds and intact distal pulses.  Exam reveals no gallop  and no friction rub.   No murmur heard. Pulmonary/Chest: Effort normal and breath sounds normal. No stridor. No respiratory distress. She has no wheezes. She has no rales.  Abdominal: Soft. Bowel sounds are normal. She exhibits no distension and no mass. There is no tenderness. There is no rebound and no guarding.  Musculoskeletal: Normal range of motion. She exhibits no edema, tenderness or deformity.  Moves all extremities, no deformity or edema noted.  Passive ROM of all extremities normal w/o pain, no tenderness to palpation of all extremities No midline tenderness or step off from cervical to lumbar spine  Neurological: She is alert. Coordination normal.  Oriented to  person, attempts to answer questions with confused answers but clear speech Will follow simple commands Moves all extremities with normal coordination and tone  Skin: Skin is warm and dry. She is not diaphoretic. No erythema.  Nursing note and vitals reviewed.    ED Treatments / Results  Labs (all labs ordered are listed, but only abnormal results are displayed) Labs Reviewed  URINALYSIS, [place] (NOT AT [facility]) - Abnormal; Notable for the following:       Result Value   Nitrite POSITIVE (*)    Leukocytes, UA TRACE (*)    All other components within normal limits  URINE MICROSCOPIC-ADD ON - Abnormal; Notable for the following:    Squamous Epithelial / LPF 0-5 (*)    Bacteria, UA MANY (*)    Casts HYALINE CASTS (*)    All other components within normal limits  URINE CULTURE    EKG  EKG Interpretation  Date/Time:  Wednesday December 03 2015 03:19:17 EST Ventricular Rate:  80 PR Interval:    QRS Duration: 107 QT Interval:  429 QTC Calculation: 495 R Axis:   -77 Text Interpretation:  Sinus rhythm Left anterior fascicular block Abnormal R-wave progression, late transition Borderline prolonged QT interval No significant change was found Confirmed by Wyvonnia Dusky  MD, Annie Main [id number]) on 12/03/2015  3:54:13 AM Also confirmed by Wyvonnia Dusky  MD, STEPHEN [phone]), editor [place]  CCT, BEVERLY ([id number])  on 12/03/2015 6:38:13 AM       Radiology Dg Chest 2 View  Result Date: 12/03/2015 CLINICAL DATA:  Unwitnessed fall tonight. Left chest and shoulder pain. EXAM: CHEST  2 VIEW COMPARISON:  02/17/2015 FINDINGS: Shallow inspiration with atelectasis in the lung bases. Normal heart size and pulmonary vascularity. No focal airspace disease or consolidation in the lungs. No blunting of costophrenic angles. No pneumothorax. Mediastinal contours appear intact. Calcification of the aorta. Degenerative changes in the spine. IMPRESSION: Shallow inspiration with atelectasis in the lung bases. Electronically Signed   By: Lucienne Capers M.D.   On: 12/03/2015 03:49   Dg Pelvis 1-2 Views  Result Date: 12/03/2015 CLINICAL DATA:  Unwitnessed fall tonight. EXAM: PELVIS - 1-2 VIEW COMPARISON:  10/11/2013 FINDINGS: Degenerative changes in the lower lumbar spine. Pelvis and hips appear intact. No acute displaced fractures identified. SI joints and symphysis pubis are not displaced. Calcifications in the pelvis likely representing calcified uterine fibroids. Vascular calcifications. IMPRESSION: No acute bony abnormalities. Electronically Signed   By: Lucienne Capers M.D.   On: 12/03/2015 03:50   Ct Head Wo Contrast  Result Date: 12/03/2015 CLINICAL DATA:  Unwitnessed fall.  Dementia. EXAM: CT HEAD WITHOUT CONTRAST CT CERVICAL SPINE WITHOUT CONTRAST TECHNIQUE: Multidetector CT imaging of the head and cervical spine was performed following the standard protocol without intravenous contrast. Multiplanar CT image reconstructions of the cervical spine were also generated. COMPARISON:  11/10/2015 FINDINGS: CT HEAD FINDINGS Brain: Chronic left occipital lobe infarct. Moderate degree of chronic small vessel ischemic disease of periventricular and subcortical white matter. Central atrophy is stable. No acute large vascular territory  infarction, intra-axial mass nor extra-axial fluid collections. No acute intracranial hemorrhage. Vascular: No hyperdense vessels. Extensive atherosclerosis of the carotid siphons and bilateral vertebral arteries. Skull: No acute osseous abnormality Sinuses/Orbits: No acute findings. Bilateral intra-ocular lens replacement. Other: No significant soft tissue swelling of the scalp. CT CERVICAL SPINE FINDINGS Alignment: There is slight reversal cervical lordosis attributable to degenerative disc disease with apex at C5. The atlantodental interval is maintained as is the craniocervical relationship. Skull  base and vertebrae: No skull fracture. No primary bone lesion nor bone destruction. Soft tissues and spinal canal: No prevertebral soft tissue swelling or fluid. No significant central canal stenosis. Disc levels: Cervical spondylosis is again noted from C4 through C6 with disc space narrowing and small posterior marginal osteophytes most prominent at C5-6. Uncovertebral osteoarthritis is noted bilaterally from C4 through C7.) no high-grade bony canal stenosis or foraminal narrowing. Upper chest: Negative Other: Atherosclerosis of the carotid bifurcations bilaterally. Subcentimeter bilateral thyroid nodules as before. Aortic atherosclerosis involving the arch. Clear lung apices IMPRESSION: Chronic left occipital lobe infarct with volume loss. Moderate degree of periventricular and subcortical chronic small vessel ischemia. No acute intracranial abnormality. Cervical spondylosis most marked from C4 through C6. No acute osseous abnormality of the cervical spine. Electronically Signed   By: Ashley Royalty M.D.   On: 12/03/2015 03:11   Ct Cervical Spine Wo Contrast  Result Date: 12/03/2015 CLINICAL DATA:  Unwitnessed fall.  Dementia. EXAM: CT HEAD WITHOUT CONTRAST CT CERVICAL SPINE WITHOUT CONTRAST TECHNIQUE: Multidetector CT imaging of the head and cervical spine was performed following the standard protocol without  intravenous contrast. Multiplanar CT image reconstructions of the cervical spine were also generated. COMPARISON:  11/10/2015 FINDINGS: CT HEAD FINDINGS Brain: Chronic left occipital lobe infarct. Moderate degree of chronic small vessel ischemic disease of periventricular and subcortical white matter. Central atrophy is stable. No acute large vascular territory infarction, intra-axial mass nor extra-axial fluid collections. No acute intracranial hemorrhage. Vascular: No hyperdense vessels. Extensive atherosclerosis of the carotid siphons and bilateral vertebral arteries. Skull: No acute osseous abnormality Sinuses/Orbits: No acute findings. Bilateral intra-ocular lens replacement. Other: No significant soft tissue swelling of the scalp. CT CERVICAL SPINE FINDINGS Alignment: There is slight reversal cervical lordosis attributable to degenerative disc disease with apex at C5. The atlantodental interval is maintained as is the craniocervical relationship. Skull base and vertebrae: No skull fracture. No primary bone lesion nor bone destruction. Soft tissues and spinal canal: No prevertebral soft tissue swelling or fluid. No significant central canal stenosis. Disc levels: Cervical spondylosis is again noted from C4 through C6 with disc space narrowing and small posterior marginal osteophytes most prominent at C5-6. Uncovertebral osteoarthritis is noted bilaterally from C4 through C7.) no high-grade bony canal stenosis or foraminal narrowing. Upper chest: Negative Other: Atherosclerosis of the carotid bifurcations bilaterally. Subcentimeter bilateral thyroid nodules as before. Aortic atherosclerosis involving the arch. Clear lung apices IMPRESSION: Chronic left occipital lobe infarct with volume loss. Moderate degree of periventricular and subcortical chronic small vessel ischemia. No acute intracranial abnormality. Cervical spondylosis most marked from C4 through C6. No acute osseous abnormality of the cervical spine.  Electronically Signed   By: Ashley Royalty M.D.   On: 12/03/2015 03:11    Procedures Procedures (including critical care time)  Medications Ordered in ED Medications  cephALEXin (KEFLEX) capsule 500 mg (500 mg Oral Given 12/03/15 0620)     Initial Impression / Assessment and Plan / ED Course  I have reviewed the triage vital signs and the nursing notes.  Pertinent labs & imaging results that were available during my care of the patient were reviewed by me and considered in my medical decision making (see chart for details).  Clinical Course   Elderly female with dementia with unwitnessed fall overnight, found on the floor between her bed and nightstand, alert and oriented to person, at her baseline mental status.  She lives at a nursing facility who contacted her children who insisted she come to  the ER for evaluation.  She complained to EMS of left arm pain but in the ER has no pain complaints, is well-appearing, no visible contusions, abrasions, lacerations or hematoma. She can move all of her extremities, all extremities palpated and range of motion normal without any tenderness or deformity.  No midline tenderness from cervical lumbar spine.  On arrival to the ER she was in a towel/C collar, unclear why this was applied bed head CT and cervical spine CT ordered to rule out injuries or bleeds. She is on Plavix, no blood thinners.  History is very limited secondary to dementia and minimal information available from nursing facility.  Seen in a shared visit with Dr. Wyvonnia Dusky.  Chest x-ray negative, pelvis x-ray negative, CT head and neck negative for acute pathology.  EKG unchanged from prior.  Vital signs stable and within normal limits. Patient well-appearing.  Urinalysis nitrite positive with many bacteria, will treat for UTI. No abdominal tenderness, flank tenderness, nausea or vomiting, tolerating PO's.  Per chart review she has had Rocephin multiple times in the past, has a documented unknown  penicillin allergy, we will give Keflex.  Urine culture pending.  Discussed findings with the patient's son. Patient was discharged home with prescription of Keflex and close PCP follow-up  Final Clinical Impressions(s) / ED Diagnoses   Final diagnoses:  Lower urinary tract infectious disease  Fall, initial encounter    New Prescriptions Discharge Medication List as of 12/03/2015  6:29 AM    START taking these medications   Details  cephALEXin (KEFLEX) 500 MG capsule Take 1 capsule (500 mg total) by mouth 3 (three) times daily., Starting Wed 12/03/2015, Until Wed 12/10/2015, Print         Delsa Grana, PA-C 12/03/15 [place], MD 12/03/15 [phone]

## 2015-12-03 NOTE — ED Notes (Addendum)
[facility]. Spoke with [employer], med tech, who is familiar with patient. Inquired as to whether patient is able to ambulate since patient has Alzheimers and is unable to answer questions. Informed that patient does not ambulate. Will notify EDP.

## 2015-12-03 NOTE — ED Notes (Signed)
Patient transported to X-ray 

## 2015-12-03 NOTE — ED Notes (Signed)
Patient transported to CT 

## 2015-12-05 LAB — URINE CULTURE: Culture: >=100000 — AB

## 2015-12-06 ENCOUNTER — Telehealth (HOSPITAL_BASED_OUTPATIENT_CLINIC_OR_DEPARTMENT_OTHER): Payer: Self-pay

## 2015-12-06 NOTE — Telephone Encounter (Signed)
Post ED Visit - Positive Culture Follow-up  Culture report reviewed by antimicrobial stewardship pharmacist:  []  Elenor Quinones, Pharm.D. []  Heide Guile, Pharm.D., BCPS [x]  Parks Neptune, Pharm.D. []  Alycia Rossetti, Pharm.D., BCPS []  [place], [place].D., BCPS, AAHIVP []  Legrand Como, Pharm.D., BCPS, AAHIVP []  Milus Glazier, Pharm.D. []  Stephens November, Pharm.D.  Positive urine culture Treated with Cephalexin, organism sensitive to the same and no further patient follow-up is required at this time.  Genia Del 12/06/2015, 9:23 AM

## 2016-02-14 ENCOUNTER — Emergency Department (HOSPITAL_COMMUNITY): Payer: Medicare Other

## 2016-02-14 ENCOUNTER — Emergency Department (HOSPITAL_COMMUNITY)
Admission: EM | Admit: 2016-02-14 | Discharge: 2016-02-14 | Disposition: A | Discharge status: Home / Self Care | Payer: Medicare Other | Attending: Emergency Medicine | Admitting: Emergency Medicine

## 2016-02-14 ENCOUNTER — Encounter (HOSPITAL_COMMUNITY): Payer: Self-pay

## 2016-02-14 DIAGNOSIS — I10 Essential (primary) hypertension: Secondary | ICD-10-CM | POA: Insufficient documentation

## 2016-02-14 DIAGNOSIS — Y999 Unspecified external cause status: Secondary | ICD-10-CM | POA: Diagnosis not present

## 2016-02-14 DIAGNOSIS — S0083XA Contusion of other part of head, initial encounter: Secondary | ICD-10-CM | POA: Diagnosis not present

## 2016-02-14 DIAGNOSIS — Y929 Unspecified place or not applicable: Secondary | ICD-10-CM | POA: Insufficient documentation

## 2016-02-14 DIAGNOSIS — R930 Abnormal findings on diagnostic imaging of skull and head, not elsewhere classified: Secondary | ICD-10-CM | POA: Diagnosis not present

## 2016-02-14 DIAGNOSIS — Y939 Activity, unspecified: Secondary | ICD-10-CM | POA: Insufficient documentation

## 2016-02-14 DIAGNOSIS — S80212A Abrasion, left knee, initial encounter: Secondary | ICD-10-CM | POA: Insufficient documentation

## 2016-02-14 DIAGNOSIS — Z853 Personal history of malignant neoplasm of breast: Secondary | ICD-10-CM | POA: Insufficient documentation

## 2016-02-14 DIAGNOSIS — W19XXXA Unspecified fall, initial encounter: Secondary | ICD-10-CM | POA: Insufficient documentation

## 2016-02-14 DIAGNOSIS — S0990XA Unspecified injury of head, initial encounter: Secondary | ICD-10-CM

## 2016-02-14 DIAGNOSIS — N390 Urinary tract infection, site not specified: Secondary | ICD-10-CM | POA: Diagnosis not present

## 2016-02-14 DIAGNOSIS — Z8673 Personal history of transient ischemic attack (TIA), and cerebral infarction without residual deficits: Secondary | ICD-10-CM | POA: Diagnosis not present

## 2016-02-14 DIAGNOSIS — M25562 Pain in left knee: Secondary | ICD-10-CM

## 2016-02-14 LAB — CBC WITH DIFFERENTIAL/PLATELET
BASOS PCT: 1 %
Basophils Absolute: 0 10*3/uL (ref 0.0–0.1)
Eosinophils Absolute: 0.2 10*3/uL (ref 0.0–0.7)
Eosinophils Relative: 3 %
HEMATOCRIT: 29.4 % — AB (ref 36.0–46.0)
HEMOGLOBIN: 9.1 g/dL — AB (ref 12.0–15.0)
Lymphocytes Relative: 18 %
Lymphs Abs: 1.2 10*3/uL (ref 0.7–4.0)
MCH: 26.5 pg (ref 26.0–34.0)
MCHC: 31 g/dL (ref 30.0–36.0)
MCV: 85.7 fL (ref 78.0–100.0)
MONOS PCT: 7 %
Monocytes Absolute: 0.5 10*3/uL (ref 0.1–1.0)
NEUTROS ABS: 4.7 10*3/uL (ref 1.7–7.7)
NEUTROS PCT: 71 %
Platelets: 327 10*3/uL (ref 150–400)
RBC: 3.43 MIL/uL — AB (ref 3.87–5.11)
RDW: 15.2 % (ref 11.5–15.5)
WBC: 6.6 10*3/uL (ref 4.0–10.5)

## 2016-02-14 LAB — COMPREHENSIVE METABOLIC PANEL
ALBUMIN: 3.1 g/dL — AB (ref 3.5–5.0)
ALK PHOS: 48 U/L (ref 38–126)
ALT: 15 U/L (ref 14–54)
ANION GAP: 7 (ref 5–15)
AST: 23 U/L (ref 15–41)
BILIRUBIN TOTAL: 0.9 mg/dL (ref 0.3–1.2)
BUN: 24 mg/dL — AB (ref 6–20)
CALCIUM: 8.8 mg/dL — AB (ref 8.9–10.3)
CO2: 29 mmol/L (ref 22–32)
CREATININE: 1.19 mg/dL — AB (ref 0.44–1.00)
Chloride: 105 mmol/L (ref 101–111)
GFR calc Af Amer: 46 mL/min — ABNORMAL LOW (ref >60)
GFR calc non Af Amer: 40 mL/min — ABNORMAL LOW (ref >60)
GLUCOSE: 120 mg/dL — AB (ref 65–99)
Potassium: 3.9 mmol/L (ref 3.5–5.1)
Sodium: 141 mmol/L (ref 135–145)
TOTAL PROTEIN: 6.2 g/dL — AB (ref 6.5–8.1)

## 2016-02-14 LAB — LIPASE, BLOOD: Lipase: 19 U/L (ref 11–51)

## 2016-02-14 LAB — URINALYSIS, ROUTINE W REFLEX MICROSCOPIC
BILIRUBIN URINE: NEGATIVE
Glucose, UA: NEGATIVE mg/dL
Hgb urine dipstick: NEGATIVE
KETONES UR: NEGATIVE mg/dL
Nitrite: POSITIVE — AB
PROTEIN: NEGATIVE mg/dL
Specific Gravity, Urine: 1.015 (ref 1.005–1.030)
pH: 5 (ref 5.0–8.0)

## 2016-02-14 LAB — I-STAT CG4 LACTIC ACID, ED: Lactic Acid, Venous: 0.47 mmol/L — ABNORMAL LOW (ref 0.5–1.9)

## 2016-02-14 LAB — CK: Total CK: 261 U/L — ABNORMAL HIGH (ref 38–234)

## 2016-02-14 MED ORDER — NITROFURANTOIN MONOHYD MACRO 100 MG PO CAPS
100.0000 mg | ORAL_CAPSULE | Freq: Once | ORAL | Status: AC
  Administered 2016-02-14: 100 mg via ORAL
  Filled 2016-02-14: qty 1

## 2016-02-14 MED ORDER — SODIUM CHLORIDE 0.9 % IV BOLUS (SEPSIS)
500.0000 mL | Freq: Once | INTRAVENOUS | Status: AC
  Administered 2016-02-14: 500 mL via INTRAVENOUS

## 2016-02-14 MED ORDER — NITROFURANTOIN MONOHYD MACRO 100 MG PO CAPS: 100.0000 mg | ORAL_CAPSULE | Freq: Two times a day (BID) | ORAL | 0 refills | Status: AC

## 2016-02-14 NOTE — ED Triage Notes (Signed)
Per [facility]- Pt resides at [facility]. Unwitnessed fall. Pt found on left side bedside of bed. Fall occurred between 0730-0800. Unsure of LOC. HX of Dementia. Per Staff pt is at baseline. Towel roll for spinal support. Contusion over left eye, Pt states she has left knee pain. Pt would not allow BP in left arm also. Pt has strong urine smell and temp of 99 temporal.

## 2016-02-14 NOTE — ED Notes (Signed)
Pt placed on bedpan

## 2016-02-14 NOTE — ED Notes (Signed)
No output at present time.

## 2016-02-14 NOTE — ED Notes (Signed)
Per [facility] attempt in and out catheter once more. [place] NT attempt in and out catheter successful.

## 2016-02-14 NOTE — Discharge Instructions (Signed)
Please take your antibiotics twice a day to treat your urinary tract infection. Please follow-up with your primary care physician for further management. If any symptoms worsen, please return to the nearest emergency department.

## 2016-02-14 NOTE — ED Notes (Signed)
Provider at bedside

## 2016-02-14 NOTE — ED Notes (Signed)
Awaiting medication from main pharmacy.

## 2016-02-14 NOTE — ED Notes (Signed)
Pt transferred to [facility] waiting for transport to [facility]. [facility] has been called.

## 2016-02-14 NOTE — ED Notes (Signed)
Pt incontinent into brief. Two unsuccessful in and out catheter attempts with [place] NT related to pt clinching. Per [facility] collect via bedpan post imaging completion; aware sample will be dirty.

## 2016-02-14 NOTE — ED Provider Notes (Signed)
[facility] DEPT Provider Note   CSN: [id number] Arrival date & time: 02/14/16  0904     History   Chief Complaint Chief Complaint  Patient presents with  . Fall  . Knee Pain  . Facial Swelling    contusion above left eye  . Dementia  . Fever    HPI Anne Compton is a 83 y.o. female With a past medical history significant for prior stroke, hypertension, breast cancer, and dementia who presents from her nursing facility after being found on the floor next to the bed concerning for a fall and complaining of pain on her head and her left knee. Patient does not remember the fall.. According to patient's family, this is not the first time she has fallen. She reports mild pain in her head and her left knee but denies any chest pain, palpitations, shortness of breath, nausea, vomiting, or abdominal pain. She does report a mild dry cough and also reports some dysuria. She denies radiation of her pain, reports not taking medicines, reports it hurts when she is palpated in these areas, and denies any other complaints. She is unable to describe the fall and is unsure what time it occurred.  HPI  Past Medical History:  Diagnosis Date  . Breast cancer ([place])   . Cancer ([place])   . Hypertension   . Stroke [facility])    TIA  . Transient cerebrovascular ischemia   . Vascular dementia     Patient Active Problem List   Diagnosis Date Noted  . Leukocytosis 02/15/2015  . UTI (urinary tract infection) 02/15/2015  . Anemia 02/15/2015  . Acute encephalopathy 02/15/2015  . Hypokalemia 02/15/2015  . Abdominal pain 02/14/2015  . Status post nail surgery 11/04/2014  . Ingrown nail 10/28/2014  . Paronychia 10/28/2014  . Onychomycosis 10/28/2014  . Vascular dementia     Past Surgical History:  Procedure Laterality Date  . ESOPHAGOGASTRODUODENOSCOPY Left 02/18/2015   Procedure: ESOPHAGOGASTRODUODENOSCOPY (EGD);  Surgeon: Carol Ada, MD;  Location: [facility] ENDOSCOPY;  Service: Endoscopy;   Laterality: Left;  . JOINT REPLACEMENT      OB History    No data available       Home Medications    Prior to Admission medications   Medication Sig Start Date End Date Taking? Authorizing Provider  Cholecalciferol 1000 UNITS tablet Take 1,000 Units by mouth every morning.     Historical Provider, MD  citalopram (CELEXA) 10 MG tablet Take 10 mg by mouth daily.    Historical Provider, MD  clopidogrel (PLAVIX) 75 MG tablet Take 75 mg by mouth daily.      Historical Provider, MD  donepezil (ARICEPT) 10 MG tablet Take 10 mg by mouth at bedtime.    Historical Provider, MD  ferrous sulfate 325 (65 FE) MG tablet Take 325 mg by mouth 3 (three) times daily with meals.     Historical Provider, MD  fluticasone (FLONASE) 50 MCG/ACT nasal spray Place 2 sprays into both nostrils daily.     Historical Provider, MD  hydrALAZINE (APRESOLINE) 50 MG tablet Take 50 mg by mouth 3 (three) times daily.    Historical Provider, MD  labetalol (NORMODYNE) 100 MG tablet Take 100 mg by mouth 3 (three) times daily.    Historical Provider, MD  LORazepam (ATIVAN) 0.5 MG tablet Take 1 tablet (0.5 mg total) by mouth 2 (two) times daily as needed for anxiety. Patient not taking: Reported on 12/03/2015 02/20/15   [name], MD  losartan (COZAAR) 50 MG tablet  Take 50 mg by mouth daily.    Historical Provider, MD  Multiple Vitamin (MULTIVITAMIN) tablet Take 1 tablet by mouth daily.    Historical Provider, MD  pantoprazole (PROTONIX) 20 MG tablet Take 20 mg by mouth daily.    Historical Provider, MD  pantoprazole (PROTONIX) 40 MG tablet Take 1 tablet (40 mg total) by mouth daily. Patient not taking: Reported on 12/03/2015 02/20/15   [name], MD  QUEtiapine (SEROQUEL) 25 MG tablet Take 12.5 mg by mouth at bedtime.    Historical Provider, MD  ranitidine (ZANTAC) 75 MG tablet Take 75 mg by mouth daily.     Historical Provider, MD  traMADol (ULTRAM) 50 MG tablet Take 1 tablet (50 mg total) by mouth 2 (two) times daily. For  pain 02/20/15   [name], MD  triamterene-hydrochlorothiazide (DYAZIDE) 37.5-25 MG capsule Take 1 capsule by mouth daily.    Historical Provider, MD  vitamin B-12 (CYANOCOBALAMIN) 250 MCG tablet Take 1 tablet (250 mcg total) by mouth daily. Patient not taking: Reported on 12/03/2015 02/20/15   [name], MD    Family History No family history on file.  Social History Social History  Substance Use Topics  . Smoking status: Unknown If Ever Smoked  . Smokeless tobacco: Never Used  . Alcohol use No     Comment: pt lives in LTCF     Allergies   Penicillins   Review of Systems Review of Systems  Unable to perform ROS: Dementia  Constitutional: Negative for chills, fatigue and fever.  Respiratory: Positive for cough. Negative for chest tightness.   Cardiovascular: Negative for chest pain.  Genitourinary: Positive for dysuria. Negative for flank pain.  Musculoskeletal: Negative for back pain, neck pain and neck stiffness.  Skin: Negative for rash and wound.  Neurological: Positive for headaches. Negative for syncope.  All other systems reviewed and are negative.    Physical Exam Updated Vital Signs BP (!) 143/128 (BP Location: Right Arm)   Pulse 67   Temp 97.5 F (36.4 C) (Oral)   Resp 18   SpO2 96%   Physical Exam  Constitutional: She is oriented to person, place, and time. She appears well-developed and well-nourished. No distress.  HENT:  Head: Head is with contusion.    Right Ear: External ear normal.  Left Ear: External ear normal.  Nose: Nose normal.  Mouth/Throat: Oropharynx is clear and moist. No oropharyngeal exudate.  Eyes: Conjunctivae and EOM are normal. Pupils are equal, round, and reactive to light.  Neck: Normal range of motion. Neck supple.  Pulmonary/Chest: Effort normal and breath sounds normal. No stridor. No respiratory distress. She has no wheezes. She exhibits no tenderness.  Abdominal: She exhibits no distension. There is no tenderness.  There is no rebound.  Musculoskeletal: She exhibits tenderness.       Left knee: She exhibits normal range of motion, no effusion, no deformity, no laceration and no erythema. Tenderness found.       Legs: Neurological: She is alert and oriented to person, place, and time. She has normal reflexes. She displays no tremor. No cranial nerve deficit or sensory deficit. She exhibits normal muscle tone. Coordination normal. GCS eye subscore is 4. GCS verbal subscore is 5. GCS motor subscore is 6.  Skin: Skin is warm. Capillary refill takes less than 2 seconds. No rash noted. She is not diaphoretic. No erythema.  Nursing note and vitals reviewed.    ED Treatments / Results  Labs (all labs ordered are  listed, but only abnormal results are displayed) Labs Reviewed  URINE CULTURE - Abnormal; Notable for the following:       Result Value   Culture >=100,000 COLONIES/mL ESCHERICHIA COLI (*)    Organism ID, Bacteria ESCHERICHIA COLI (*)    All other components within normal limits  CBC WITH DIFFERENTIAL/PLATELET - Abnormal; Notable for the following:    RBC 3.43 (*)    Hemoglobin 9.1 (*)    HCT 29.4 (*)    All other components within normal limits  COMPREHENSIVE METABOLIC PANEL - Abnormal; Notable for the following:    Glucose, Bld 120 (*)    BUN 24 (*)    Creatinine, Ser 1.19 (*)    Calcium 8.8 (*)    Total Protein 6.2 (*)    Albumin 3.1 (*)    GFR calc non Af Amer 40 (*)    GFR calc Af Amer 46 (*)    All other components within normal limits  URINALYSIS, ROUTINE W REFLEX MICROSCOPIC - Abnormal; Notable for the following:    Nitrite POSITIVE (*)    Leukocytes, UA TRACE (*)    Bacteria, UA RARE (*)    Squamous Epithelial / LPF 0-5 (*)    All other components within normal limits  CK - Abnormal; Notable for the following:    Total CK 261 (*)    All other components within normal limits  I-STAT CG4 LACTIC ACID, ED - Abnormal; Notable for the following:    Lactic Acid, Venous 0.47 (*)     All other components within normal limits  LIPASE, BLOOD    EKG  EKG Interpretation None       Radiology Dg Chest 2 View  Result Date: 02/14/2016 CLINICAL DATA:  Cough EXAM: CHEST  2 VIEW COMPARISON:  12/03/2015 FINDINGS: There is no focal parenchymal opacity. There is no pleural effusion or pneumothorax. The heart and mediastinal contours are unremarkable. The osseous structures are unremarkable. IMPRESSION: No active cardiopulmonary disease. Electronically Signed   By: Kathreen Devoid   On: 02/14/2016 11:17   Ct Head Wo Contrast  Result Date: 02/14/2016 CLINICAL DATA:  [place] off bed, dementia EXAM: CT HEAD WITHOUT CONTRAST CT CERVICAL SPINE WITHOUT CONTRAST TECHNIQUE: Multidetector CT imaging of the head and cervical spine was performed following the standard protocol without intravenous contrast. Multiplanar CT image reconstructions of the cervical spine were also generated. COMPARISON:  12/03/2015 FINDINGS: CT HEAD FINDINGS Brain: No intracranial hemorrhage, mass effect or midline shift. Stable atrophy and chronic white matter disease. Old infarct in left occipital lobe with encephalomalacia is stable. No acute cortical infarction. No mass lesion is noted on this unenhanced scan. Vascular: Atherosclerotic calcifications of carotid siphon. Atherosclerotic calcifications of vertebral arteries. Skull: No skull fracture is noted. Sinuses/Orbits: No acute findings Other: None CT CERVICAL SPINE FINDINGS Alignment: Again noted mild reversal of cervical lordosis. C1-C2 relationship is unremarkable. There is mild anterolisthesis C3 on C4 vertebral body about 2 mm. Skull base and vertebrae: No acute fracture is noted. Soft tissues and spinal canal: No prevertebral soft tissue swelling. No significant spinal canal stenosis. Disc levels: There is disc space flattening with mild anterior and mild posterior spurring at C4-C5, C5-C6 and C6-C7 level. Minimal disc space flattening with anterior spurring at T1-T2  level. Upper chest: There is no pneumothorax in visualized lung apices. Other: Cervical airway is patent. IMPRESSION: 1. No acute intracranial abnormality. Stable atrophy and chronic white matter disease. Stable old infarct and encephalomalacia in left occipital lobe. 2. No  cervical spine acute fracture. There is about 2 mm anterolisthesis C3 on C4 vertebral body. Degenerative changes at C4-C5, C5-C6 and C6-C7 level. No prevertebral soft tissue swelling. Cervical airway is patent. Electronically Signed   By: Lahoma Crocker M.D.   On: 02/14/2016 11:20   Ct Cervical Spine Wo Contrast  Result Date: 02/14/2016 CLINICAL DATA:  [place] off bed, dementia EXAM: CT HEAD WITHOUT CONTRAST CT CERVICAL SPINE WITHOUT CONTRAST TECHNIQUE: Multidetector CT imaging of the head and cervical spine was performed following the standard protocol without intravenous contrast. Multiplanar CT image reconstructions of the cervical spine were also generated. COMPARISON:  12/03/2015 FINDINGS: CT HEAD FINDINGS Brain: No intracranial hemorrhage, mass effect or midline shift. Stable atrophy and chronic white matter disease. Old infarct in left occipital lobe with encephalomalacia is stable. No acute cortical infarction. No mass lesion is noted on this unenhanced scan. Vascular: Atherosclerotic calcifications of carotid siphon. Atherosclerotic calcifications of vertebral arteries. Skull: No skull fracture is noted. Sinuses/Orbits: No acute findings Other: None CT CERVICAL SPINE FINDINGS Alignment: Again noted mild reversal of cervical lordosis. C1-C2 relationship is unremarkable. There is mild anterolisthesis C3 on C4 vertebral body about 2 mm. Skull base and vertebrae: No acute fracture is noted. Soft tissues and spinal canal: No prevertebral soft tissue swelling. No significant spinal canal stenosis. Disc levels: There is disc space flattening with mild anterior and mild posterior spurring at C4-C5, C5-C6 and C6-C7 level. Minimal disc space  flattening with anterior spurring at T1-T2 level. Upper chest: There is no pneumothorax in visualized lung apices. Other: Cervical airway is patent. IMPRESSION: 1. No acute intracranial abnormality. Stable atrophy and chronic white matter disease. Stable old infarct and encephalomalacia in left occipital lobe. 2. No cervical spine acute fracture. There is about 2 mm anterolisthesis C3 on C4 vertebral body. Degenerative changes at C4-C5, C5-C6 and C6-C7 level. No prevertebral soft tissue swelling. Cervical airway is patent. Electronically Signed   By: Lahoma Crocker M.D.   On: 02/14/2016 11:20   Dg Knee Complete 4 Views Left  Result Date: 02/14/2016 CLINICAL DATA:  Fall.  Left knee pain.  Initial encounter. EXAM: LEFT KNEE - COMPLETE 4+ VIEW COMPARISON:  None. FINDINGS: No acute fracture, dislocation or joint effusion identified. There is evidence of advanced osteoarthritis with near complete loss of medial joint space height. Mild patellofemoral proliferative disease present. No bony lesions or destruction. Arterial vascular calcifications are seen involving the distal SFA, popliteal artery and proximal tibial arteries. IMPRESSION: No acute findings. Advanced osteoarthritis with near complete loss of medial joint space height. Electronically Signed   By: Aletta Edouard M.D.   On: 02/14/2016 11:19    Procedures Procedures (including critical care time)  Medications Ordered in ED Medications  sodium chloride 0.9 % bolus 500 mL (0 mLs Intravenous Stopped 02/14/16 1112)  nitrofurantoin (macrocrystal-monohydrate) (MACROBID) capsule 100 mg (100 mg Oral Given 02/14/16 1530)     Initial Impression / Assessment and Plan / ED Course  I have reviewed the triage vital signs and the nursing notes.  Pertinent labs & imaging results that were available during my care of the patient were reviewed by me and considered in my medical decision making (see chart for details).     Anne Compton is a 83 y.o. female  With a past medical history significant for prior stroke, hypertension, breast cancer, and dementia who presents from her nursing facility after being found on the floor next to the bed concerning for a fall and complaining of pain  on her head and her left knee.   History and exam are seen above.  On exam, only significant findings were abrasion on left knee and contusion to R forehead. No focal neurologic deficits all the patient is confused at baseline per son. According to the sun, she is at her normal mental status at this time. Lungs were clear and abdomen nontender.  Given concern for fall, patient had CT imaging of the head and neck. With her dry cough, patient had chest x-ray. With the knee tenderness, patient had knee x-ray. Patient also had workup for occult infection. Given unknown downtime, CK ordered.  Diagnostic workup returned revealing evidence of urinary tract infection in the setting of dysuria. CK slightly elevated at 261. Other laboratory testing appeared similar to prior. Chest x-ray did not show pneumonia. CT of the head and neck did not show acute fracture or intracranial bleeds.  Based on previous cultures And antibiotic allergies/intolerances, decision made to give patient nitrofurantoin to treat her UTI. Son was present and understood plan of treatment. Patient felt stable to back to her facility. Son reports he is looking into getting a curved mattress bed to prevent her rolling off it.  Family understood return precautions for any new or worsened symptoms or signs of worsening infection. Patient discharged in good condition back To her facility.    Final Clinical Impressions(s) / ED Diagnoses   Final diagnoses:  Fall  Lower urinary tract infectious disease  Acute pain of left knee  Injury of head, initial encounter    New Prescriptions Discharge Medication List as of 02/14/2016  3:12 PM    START taking these medications   Details  nitrofurantoin,  macrocrystal-monohydrate, (MACROBID) 100 MG capsule Take 1 capsule (100 mg total) by mouth 2 (two) times daily., Starting Sat 02/14/2016, Print       Clinical Impression: 1. Lower urinary tract infectious disease   2. Fall   3. Acute pain of left knee   4. Injury of head, initial encounter     Disposition: Discharge  Condition: Good  I have discussed the results, Dx and Tx plan with the pt(& family if present). He/she/they expressed understanding and agree(s) with the plan. Discharge instructions discussed at great length. Strict return precautions discussed and pt &/or family have verbalized understanding of the instructions. No further questions at time of discharge.    Discharge Medication List as of 02/14/2016  3:12 PM    START taking these medications   Details  nitrofurantoin, macrocrystal-monohydrate, (MACROBID) 100 MG capsule Take 1 capsule (100 mg total) by mouth 2 (two) times daily., Starting Sat 02/14/2016, Print        Follow Up: Reymundo Poll, MD [address] [place] [id number] [phone]  Schedule an appointment as soon as possible for a visit    [facility] DEPT [place] [id number] [place] [phone]  If symptoms worsen     Courtney Paris, MD 02/16/16 1037

## 2016-02-16 LAB — URINE CULTURE

## 2016-02-17 ENCOUNTER — Telehealth (HOSPITAL_BASED_OUTPATIENT_CLINIC_OR_DEPARTMENT_OTHER): Payer: Self-pay | Admitting: Emergency Medicine

## 2016-02-17 NOTE — Telephone Encounter (Signed)
Post ED Visit - Positive Culture Follow-up  Culture report reviewed by antimicrobial stewardship pharmacist:  []  Elenor Quinones, Pharm.D. []  Heide Guile, Pharm.D., BCPS []  Parks Neptune, Pharm.D. []  Alycia Rossetti, Pharm.D., BCPS []  Uncertain, Pharm.D., BCPS, AAHIVP []  Legrand Como, Pharm.D., BCPS, AAHIVP []  Milus Glazier, Pharm.D. []  Stephens November, Pharm.D. Joe Arminger PharmD  Positive urine culture Treated with nitrofurantoin, organism sensitive to the same and no further patient follow-up is required at this time.  Hazle Nordmann 02/17/2016, 10:51 AM

## 2016-02-24 ENCOUNTER — Emergency Department (HOSPITAL_COMMUNITY): Payer: Medicare Other

## 2016-02-24 ENCOUNTER — Emergency Department (HOSPITAL_COMMUNITY)
Admission: EM | Admit: 2016-02-24 | Discharge: 2016-02-26 | Disposition: E | Payer: Medicare Other | Attending: Emergency Medicine | Admitting: Emergency Medicine

## 2016-02-24 DIAGNOSIS — I10 Essential (primary) hypertension: Secondary | ICD-10-CM | POA: Insufficient documentation

## 2016-02-24 DIAGNOSIS — Z8673 Personal history of transient ischemic attack (TIA), and cerebral infarction without residual deficits: Secondary | ICD-10-CM | POA: Diagnosis not present

## 2016-02-24 DIAGNOSIS — R4182 Altered mental status, unspecified: Secondary | ICD-10-CM | POA: Diagnosis present

## 2016-02-24 DIAGNOSIS — Z79899 Other long term (current) drug therapy: Secondary | ICD-10-CM | POA: Insufficient documentation

## 2016-02-24 DIAGNOSIS — A419 Sepsis, unspecified organism: Secondary | ICD-10-CM | POA: Insufficient documentation

## 2016-02-24 DIAGNOSIS — Z853 Personal history of malignant neoplasm of breast: Secondary | ICD-10-CM | POA: Insufficient documentation

## 2016-02-24 DIAGNOSIS — Z96698 Presence of other orthopedic joint implants: Secondary | ICD-10-CM | POA: Insufficient documentation

## 2016-02-24 DIAGNOSIS — I469 Cardiac arrest, cause unspecified: Secondary | ICD-10-CM | POA: Insufficient documentation

## 2016-02-24 LAB — COMPREHENSIVE METABOLIC PANEL
ALT: 32 U/L (ref 14–54)
ANION GAP: 15 (ref 5–15)
AST: 42 U/L — ABNORMAL HIGH (ref 15–41)
Albumin: 3.2 g/dL — ABNORMAL LOW (ref 3.5–5.0)
Alkaline Phosphatase: 60 U/L (ref 38–126)
BUN: 109 mg/dL — ABNORMAL HIGH (ref 6–20)
CHLORIDE: 118 mmol/L — AB (ref 101–111)
CO2: 24 mmol/L (ref 22–32)
Calcium: 9.5 mg/dL (ref 8.9–10.3)
Creatinine, Ser: 5.36 mg/dL — ABNORMAL HIGH (ref 0.44–1.00)
GFR calc non Af Amer: 6 mL/min — ABNORMAL LOW (ref >60)
GFR, EST AFRICAN AMERICAN: 7 mL/min — AB (ref >60)
Glucose, Bld: 268 mg/dL — ABNORMAL HIGH (ref 65–99)
POTASSIUM: 4 mmol/L (ref 3.5–5.1)
SODIUM: 157 mmol/L — AB (ref 135–145)
Total Bilirubin: 0.4 mg/dL (ref 0.3–1.2)
Total Protein: 7.4 g/dL (ref 6.5–8.1)

## 2016-02-24 LAB — CBC WITH DIFFERENTIAL/PLATELET
Basophils Absolute: 0 10*3/uL (ref 0.0–0.1)
Basophils Relative: 0 %
Eosinophils Absolute: 0 10*3/uL (ref 0.0–0.7)
Eosinophils Relative: 0 %
HEMATOCRIT: 39.8 % (ref 36.0–46.0)
HEMOGLOBIN: 11.9 g/dL — AB (ref 12.0–15.0)
LYMPHS ABS: 1.6 10*3/uL (ref 0.7–4.0)
Lymphocytes Relative: 11 %
MCH: 26.5 pg (ref 26.0–34.0)
MCHC: 29.9 g/dL — AB (ref 30.0–36.0)
MCV: 88.6 fL (ref 78.0–100.0)
MONOS PCT: 3 %
Monocytes Absolute: 0.5 10*3/uL (ref 0.1–1.0)
NEUTROS ABS: 12.7 10*3/uL — AB (ref 1.7–7.7)
NEUTROS PCT: 86 %
Platelets: 236 10*3/uL (ref 150–400)
RBC: 4.49 MIL/uL (ref 3.87–5.11)
RDW: 16 % — ABNORMAL HIGH (ref 11.5–15.5)
WBC: 14.8 10*3/uL — AB (ref 4.0–10.5)

## 2016-02-24 LAB — I-STAT CG4 LACTIC ACID, ED: LACTIC ACID, VENOUS: 3.4 mmol/L — AB (ref 0.5–1.9)

## 2016-02-24 LAB — CBG MONITORING, ED: GLUCOSE-CAPILLARY: 230 mg/dL — AB (ref 65–99)

## 2016-02-24 MED ORDER — NALOXONE HCL 2 MG/2ML IJ SOSY
PREFILLED_SYRINGE | INTRAMUSCULAR | Status: AC
  Administered 2016-02-24: 2 mg
  Filled 2016-02-24: qty 2

## 2016-02-24 MED ORDER — VANCOMYCIN HCL IN DEXTROSE 1-5 GM/200ML-% IV SOLN: 1000.0000 mg | INTRAVENOUS | Status: DC

## 2016-02-24 MED ORDER — LEVOFLOXACIN IN D5W 500 MG/100ML IV SOLN: 500.0000 mg | INTRAVENOUS | Status: DC

## 2016-02-24 MED ORDER — DEXTROSE 5 % IV SOLN
500.0000 mg | Freq: Three times a day (TID) | INTRAVENOUS | Status: DC
  Filled 2016-02-24 (×2): qty 0.5

## 2016-02-24 MED ORDER — EPINEPHRINE PF 1 MG/10ML IJ SOSY
PREFILLED_SYRINGE | INTRAMUSCULAR | Status: DC | PRN
  Administered 2016-02-24 (×2): 0.5 mg via INTRAVENOUS
  Administered 2016-02-24: 1 mg via INTRAVENOUS
  Administered 2016-02-24 (×3): 0.5 mg via INTRAVENOUS

## 2016-02-24 MED ORDER — DEXTROSE 5 % IV SOLN
2.0000 g | Freq: Once | INTRAVENOUS | Status: AC
  Administered 2016-02-24: 2 g via INTRAVENOUS
  Filled 2016-02-24: qty 2

## 2016-02-24 MED ORDER — VANCOMYCIN HCL IN DEXTROSE 1-5 GM/200ML-% IV SOLN
1000.0000 mg | Freq: Once | INTRAVENOUS | Status: AC
  Administered 2016-02-24: 1000 mg via INTRAVENOUS
  Filled 2016-02-24: qty 200

## 2016-02-24 MED ORDER — NALOXONE HCL 2 MG/2ML IJ SOSY
2.0000 mg | PREFILLED_SYRINGE | Freq: Once | INTRAMUSCULAR | Status: DC
  Filled 2016-02-24: qty 2

## 2016-02-24 MED ORDER — DEXTROSE 5 % IV SOLN
1.0000 g | Freq: Three times a day (TID) | INTRAVENOUS | Status: DC
  Filled 2016-02-24: qty 1

## 2016-02-24 MED ORDER — SODIUM CHLORIDE 0.9 % IV BOLUS (SEPSIS)
1000.0000 mL | Freq: Once | INTRAVENOUS | Status: AC
  Administered 2016-02-24: 1000 mL via INTRAVENOUS

## 2016-02-24 MED ORDER — SODIUM BICARBONATE 8.4 % IV SOLN
INTRAVENOUS | Status: DC | PRN
  Administered 2016-02-24: 50 meq via INTRAVENOUS

## 2016-02-24 MED ORDER — SODIUM CHLORIDE 0.9 % IV BOLUS (SEPSIS)
250.0000 mL | Freq: Once | INTRAVENOUS | Status: AC
  Administered 2016-02-24: 250 mL via INTRAVENOUS

## 2016-02-24 MED ORDER — LEVOFLOXACIN IN D5W 750 MG/150ML IV SOLN: 750.0000 mg | INTRAVENOUS | Status: DC

## 2016-02-24 MED ORDER — ATROPINE SULFATE 1 MG/ML IJ SOLN
INTRAMUSCULAR | Status: DC | PRN
  Administered 2016-02-24: .5 mg via INTRAVENOUS

## 2016-02-24 MED ORDER — LEVOFLOXACIN IN D5W 750 MG/150ML IV SOLN
750.0000 mg | Freq: Once | INTRAVENOUS | Status: AC
  Administered 2016-02-24: 750 mg via INTRAVENOUS
  Filled 2016-02-24: qty 150

## 2016-02-24 MED ORDER — NOREPINEPHRINE BITARTRATE 1 MG/ML IV SOLN
0.0000 ug/min | Freq: Once | INTRAVENOUS | Status: DC
  Filled 2016-02-24: qty 4

## 2016-02-25 MED FILL — Medication: Qty: 1 | Status: AC

## 2016-02-26 LAB — BLOOD CULTURE ID PANEL (REFLEXED)
ACINETOBACTER BAUMANNII: NOT DETECTED
CANDIDA GLABRATA: NOT DETECTED
CANDIDA KRUSEI: NOT DETECTED
CANDIDA TROPICALIS: NOT DETECTED
Candida albicans: NOT DETECTED
Candida parapsilosis: NOT DETECTED
ENTEROBACTER CLOACAE COMPLEX: NOT DETECTED
ENTEROBACTERIACEAE SPECIES: NOT DETECTED
ESCHERICHIA COLI: NOT DETECTED
Enterococcus species: NOT DETECTED
Haemophilus influenzae: NOT DETECTED
KLEBSIELLA PNEUMONIAE: NOT DETECTED
Klebsiella oxytoca: NOT DETECTED
Listeria monocytogenes: NOT DETECTED
NEISSERIA MENINGITIDIS: NOT DETECTED
PROTEUS SPECIES: NOT DETECTED
Pseudomonas aeruginosa: NOT DETECTED
STREPTOCOCCUS AGALACTIAE: NOT DETECTED
STREPTOCOCCUS PNEUMONIAE: NOT DETECTED
Serratia marcescens: NOT DETECTED
Staphylococcus aureus (BCID): NOT DETECTED
Staphylococcus species: NOT DETECTED
Streptococcus pyogenes: NOT DETECTED
Streptococcus species: NOT DETECTED

## 2016-02-26 NOTE — ED Triage Notes (Signed)
From a dementia care facility that called out today for being more altered than normal. Baseline patient can talk and have a conversation but today is only responsive to pain. Initial pressure with EMS was 60/40, they administered 2 of narcan and pressure came up to 150/100. Patient responsive to voice and unable to speak.

## 2016-02-26 NOTE — ED Notes (Signed)
Myself and Carlis Abbott, RN cleaned patient for patient had a large runny bowel movement; washed patient and placed a clean diaper on patient; placed 3 chuks underneath patient; readjusted on stretcher and Molly, RN trendelenburged patient because of her pressures

## 2016-02-26 NOTE — Progress Notes (Signed)
eLink Physician-Brief Progress Note Patient Name: Anne Compton DOB: [birth year] MRN: [id number]   Date of Service  Mar 21, 2016  HPI/Events of Note  83 yo NHR with acute cardio-pulm arrest intubated, V tach x2, on pressors  eICU Interventions  Admit to ICU, vasopressors, Vent support, sepsis protocol High risk for death     Intervention Category Evaluation Type: New Patient Evaluation  Kasten Leveque March 21, 2016, 5:42 PM

## 2016-02-26 NOTE — Progress Notes (Signed)
Responded to page to A12. Pt coded and passed. Older son and friend were awaiting reentry to rm in hall. Provided emotional/spiritual support. Younger son is en route from [place] and has not yet been told mom passed. Son here prefers to wait for brother to offer prayer. Advised him to ask nurse to page me when he arrives.Son asked if tubes could be removed and med staff said No, not till M.E. Comes.Bonney Roussel available for f/u.   03-01-16 1800  Clinical Encounter Type  Visited With Family  Visit Type Initial;Psychological support;Spiritual support;Social support;Death  Referral From Nurse  Spiritual Encounters  Spiritual Needs Emotional;Grief support  Stress Factors  Family Stress Factors Loss   Gerrit Heck, Chaplain

## 2016-02-26 NOTE — ED Notes (Signed)
Paged PCCM to [phone]

## 2016-02-26 NOTE — ED Notes (Signed)
Molly assisted me with rectal temperature

## 2016-02-26 NOTE — Progress Notes (Signed)
Younger son arrived from [place], returned to bedside to join him and older brother there, provided spiritual/emotional support and prayer -- as well as copies of New Testament and Psalms to each. They were appreciative of all, as are from family of strong faith (in which they were led by their mom) and they know she (who had dementia and had to be in a home the last 7 yrs) is already in a much better place w/ the [facility].   Gave older son who'd been here all along the pt placement no., w/ information that he could call at any time that was appropriate for them to name the funeral home/crematorium.   03/11/16 0800  Clinical Encounter Type  Visited With Family  Visit Type Follow-up;Psychological support;Spiritual support;Social support;Death;ED  Referral From Nurse  Spiritual Encounters  Spiritual Needs Prayer;Emotional;Grief support  Stress Factors  Family Stress Factors Loss   Gerrit Heck, Chaplain

## 2016-02-26 NOTE — ED Notes (Signed)
Family @ bedside w/ Chaplain. Charge RN made aware.

## 2016-02-26 NOTE — ED Notes (Signed)
Pt bagged and sent to [facility].

## 2016-02-26 NOTE — Procedures (Signed)
Intubation Procedure Note MERCED BROUGHAM [id number] 03-06-1927  Procedure: Intubation Indications: Respiratory insufficiency  Procedure Details Consent: Unable to obtain consent because of emergent medical necessity. Time Out: Verified patient identification, verified procedure, site/side was marked, verified correct patient position, special equipment/implants available, medications/allergies/relevent history reviewed, required imaging and test results available.  Performed  Maximum sterile technique was used including cap, gloves, gown, hand hygiene and mask.  MAC and 3    Evaluation Hemodynamic Status: Persistent hypotension treated with pressors; O2 sats: transiently fell during during procedure Patient's Current Condition: unstable Complications: No apparent complications Patient did tolerate procedure well. Chest X-ray ordered to verify placement.  CXR: tube position acceptable.   Myrtie Neither March 16, 2016

## 2016-02-26 NOTE — Progress Notes (Signed)
Pharmacy Antibiotic Note  Anne Compton is a 83 y.o. female admitted on 03/18/2016 with sepsis. Pharmacy has been consulted for vancomycin, aztreonam, levaquin dosing. She is in acute renal failure with sCr 5.36 and CrCl 75ml/min.  Vancomycin trough goal 15-20  Plan: 1) Vancomycin 1g IV q48 2) Aztreonam 500mg  IV q8 3) Levaquin 500mg  IV q48  Height: 5' 2.99" (160 cm) Weight: 158 lb 11.7 oz (72 kg) IBW/kg (Calculated) : 52.38  Temp (24hrs), Avg:99.2 F (37.3 C), Min:99.2 F (37.3 C), Max:99.2 F (37.3 C)   Recent Labs Lab 03/18/16 1513  WBC 14.8*    Estimated Creatinine Clearance: 31.7 mL/min (by C-G formula based on SCr of 1.19 mg/dL (H)).    Allergies  Allergen Reactions  . Penicillins Other (See Comments)    Has patient had a PCN reaction causing immediate rash, facial/tongue/throat swelling, SOB or lightheadedness with hypotension: No Has patient had a PCN reaction causing severe rash involving mucus membranes or skin necrosis: No Has patient had a PCN reaction that required hospitalization: No Has patient had a PCN reaction occurring within the last 10 years: No If all of the above answers are "NO", then may proceed with Cephalosporin use.     Antimicrobials this admission: 1/30 Vancomycin>> 1/30 Aztreonam>> 1/30 Levaquin >>  Dose adjustments this admission: n/a  Microbiology results: 1/30 urine >> 1/30 blood x2>>  Thank you for allowing pharmacy to be a part of this patient's care.  Deboraha Sprang 03/18/2016 4:49 PM

## 2016-02-26 NOTE — ED Provider Notes (Signed)
[facility] DEPT Provider Note   CSN: [id number] Arrival date & time: 2016-03-02  1503     History   Chief Complaint Chief Complaint  Patient presents with  . Altered Mental Status    HPI Anne Compton is a 83 y.o. female.  Patient brought in by EMS from nursing facility. Patient has a history of dementia and is in a dementia unit. Patient is a full code. EMS was called in for worsening mental status from her baseline patient less talkative and patient was noted to be hypotensive.    EMS reported they gave her 2 mg of Narcan and her blood pressure improved however her mental status remained unchanged. Upon arrival here patient's blood pressures were in the 70s. Patient was breathing on her own she would try to verbalize a little bit. Patient given more Narcan 2 mg again gadolinium more active blood pressure really did not change never got pressures above 83 systolic. Patient's heart rate was in the 90s. Patient started on sepsis protocol. Patient was to receive 2.250 L of fluid. And patient started on broad-spectrum antibiotics. Chest x-ray without any significant findings no evidence of pneumonia.  Towards the end of the conclusion of the fluid challenge patient started to decompensate. Prior to that the best blood pressure we had was a systolic of 87. Patient started to get bradycardic and started to drop her pressures again.   Patient then went into V. Tach on 2 separate episodes. Was defibrillated twice and came out of it. Patient received epinephrine several times which she did respond to which brought her heart rate up blood pressure up temporarily she also received bicarbonate and she had a trial of atropine when she got bradycardic but had no effect.  Patient then went on to lose her pulse CPR was started. Still had a heart rate. Norepinephrine was started in eventually taken up to 20 with really no improvement. Patient started to have periods of asystole. Patient stopped  breathing at all on her own. As noted below patient was intubated after requiring defibrillation. Intubation was without any difficulties.   Patient eventually went and asystole and at 1800 patient was pronounced and CPR was stopped.  Family was kept informed throughout the process. One son was present. Did confirm with family that patient was a full code.  Past Medical History:  Diagnosis Date  . Breast cancer ([place])   . Cancer ([place])   . Hypertension   . Stroke [facility])    TIA  . Transient cerebrovascular ischemia   . Vascular dementia     Patient Active Problem List   Diagnosis Date Noted  . Leukocytosis 02/15/2015  . UTI (urinary tract infection) 02/15/2015  . Anemia 02/15/2015  . Acute encephalopathy 02/15/2015  . Hypokalemia 02/15/2015  . Abdominal pain 02/14/2015  . Status post nail surgery 11/04/2014  . Ingrown nail 10/28/2014  . Paronychia 10/28/2014  . Onychomycosis 10/28/2014  . Vascular dementia     Past Surgical History:  Procedure Laterality Date  . ESOPHAGOGASTRODUODENOSCOPY Left 02/18/2015   Procedure: ESOPHAGOGASTRODUODENOSCOPY (EGD);  Surgeon: Carol Ada, MD;  Location: [facility] ENDOSCOPY;  Service: Endoscopy;  Laterality: Left;  . JOINT REPLACEMENT      OB History    No data available       Home Medications    Prior to Admission medications   Medication Sig Start Date End Date Taking? Authorizing Provider  Cholecalciferol 1000 UNITS tablet Take 1,000 Units by mouth every morning.    Yes Historical  Provider, MD  citalopram (CELEXA) 10 MG tablet Take 10 mg by mouth daily.   Yes Historical Provider, MD  clopidogrel (PLAVIX) 75 MG tablet Take 75 mg by mouth daily.     Yes Historical Provider, MD  donepezil (ARICEPT) 10 MG tablet Take 10 mg by mouth at bedtime.   Yes Historical Provider, MD  ferrous sulfate 325 (65 FE) MG tablet Take 325 mg by mouth 3 (three) times daily with meals.    Yes Historical Provider, MD  fluticasone (FLONASE) 50 MCG/ACT nasal spray  Place 2 sprays into both nostrils daily.    Yes Historical Provider, MD  hydrALAZINE (APRESOLINE) 50 MG tablet Take 50 mg by mouth 3 (three) times daily.   Yes Historical Provider, MD  labetalol (NORMODYNE) 100 MG tablet Take 100 mg by mouth 3 (three) times daily.   Yes Historical Provider, MD  losartan (COZAAR) 50 MG tablet Take 50 mg by mouth daily.   Yes Historical Provider, MD  Multiple Vitamin (MULTIVITAMIN) tablet Take 1 tablet by mouth daily.   Yes Historical Provider, MD  nitrofurantoin, macrocrystal-monohydrate, (MACROBID) 100 MG capsule Take 1 capsule (100 mg total) by mouth 2 (two) times daily. 02/14/16  Yes Gwenyth Allegra Tegeler, MD  Nutritional Supplements (NUTRITIONAL SHAKE) LIQD Take 1 Container by mouth 3 (three) times daily.   Yes Historical Provider, MD  pantoprazole (PROTONIX) 20 MG tablet Take 20 mg by mouth daily.   Yes Historical Provider, MD  QUEtiapine (SEROQUEL) 25 MG tablet Take 12.5 mg by mouth at bedtime.   Yes Historical Provider, MD  ranitidine (ZANTAC) 75 MG tablet Take 75 mg by mouth daily.    Yes Historical Provider, MD  traMADol (ULTRAM) 50 MG tablet Take 1 tablet (50 mg total) by mouth 2 (two) times daily. For pain 02/20/15  Yes [name], MD  triamterene-hydrochlorothiazide (DYAZIDE) 37.5-25 MG capsule Take 1 capsule by mouth daily.   Yes Historical Provider, MD    Family History No family history on file.  Social History Social History  Substance Use Topics  . Smoking status: Unknown If Ever Smoked  . Smokeless tobacco: Never Used  . Alcohol use No     Comment: pt lives in LTCF     Allergies   Penicillins   Review of Systems Review of Systems  Unable to perform ROS: Mental status change     Physical Exam Updated Vital Signs BP (!) 84/48   Pulse 94   Temp 99.2 F (37.3 C) (Rectal)   Resp (!) 27   Ht 5' 2.99" (1.6 m)   Wt 72 kg   SpO2 97%   BMI 28.13 kg/m   Physical Exam  Constitutional: She appears well-developed and  well-nourished. She appears distressed.  HENT:  Head: Normocephalic and atraumatic.  Mucous membranes were very dry.  Eyes:  Pupils were pinpoint. Did occasionally move her eyes spontaneously.  Neck: Neck supple.  Cardiovascular: Normal rate, regular rhythm and normal heart sounds.   Pulmonary/Chest: Effort normal and breath sounds normal. No respiratory distress. She has no wheezes. She has no rales.  Abdominal: Soft. Bowel sounds are normal. She exhibits no distension.  Musculoskeletal: She exhibits no edema.  Neurological:  Only slightly responsive to stimulus. Following the Narcan more responsive. Did eventually move all extremities spontaneously.  Skin: Skin is warm.  Nursing note and vitals reviewed.    ED Treatments / Results  Labs (all labs ordered are listed, but only abnormal results are displayed) Labs Reviewed  COMPREHENSIVE METABOLIC PANEL -  Abnormal; Notable for the following:       Result Value   Sodium 157 (*)    Chloride 118 (*)    Glucose, Bld 268 (*)    BUN 109 (*)    Creatinine, Ser 5.36 (*)    Albumin 3.2 (*)    AST 42 (*)    GFR calc non Af Amer 6 (*)    GFR calc Af Amer 7 (*)    All other components within normal limits  CBC WITH DIFFERENTIAL/PLATELET - Abnormal; Notable for the following:    WBC 14.8 (*)    Hemoglobin 11.9 (*)    MCHC 29.9 (*)    RDW 16.0 (*)    Neutro Abs 12.7 (*)    All other components within normal limits  CBG MONITORING, ED - Abnormal; Notable for the following:    Glucose-Capillary 230 (*)    All other components within normal limits  I-STAT CG4 LACTIC ACID, ED - Abnormal; Notable for the following:    Lactic Acid, Venous 3.40 (*)    All other components within normal limits  CULTURE, BLOOD (ROUTINE X 2)  CULTURE, BLOOD (ROUTINE X 2)  URINE CULTURE  URINALYSIS, ROUTINE W REFLEX MICROSCOPIC  I-STAT CG4 LACTIC ACID, ED   Results for orders placed or performed during the hospital encounter of 03-19-2016  Comprehensive  metabolic panel  Result Value Ref Range   Sodium 157 (H) 135 - 145 mmol/L   Potassium 4.0 3.5 - 5.1 mmol/L   Chloride 118 (H) 101 - 111 mmol/L   CO2 24 22 - 32 mmol/L   Glucose, Bld 268 (H) 65 - 99 mg/dL   BUN 109 (H) 6 - 20 mg/dL   Creatinine, Ser 5.36 (H) 0.44 - 1.00 mg/dL   Calcium 9.5 8.9 - 10.3 mg/dL   Total Protein 7.4 6.5 - 8.1 g/dL   Albumin 3.2 (L) 3.5 - 5.0 g/dL   AST 42 (H) 15 - 41 U/L   ALT 32 14 - 54 U/L   Alkaline Phosphatase 60 38 - 126 U/L   Total Bilirubin 0.4 0.3 - 1.2 mg/dL   GFR calc non Af Amer 6 (L) >60 mL/min   GFR calc Af Amer 7 (L) >60 mL/min   Anion gap 15 5 - 15  CBC WITH DIFFERENTIAL  Result Value Ref Range   WBC 14.8 (H) 4.0 - 10.5 K/uL   RBC 4.49 3.87 - 5.11 MIL/uL   Hemoglobin 11.9 (L) 12.0 - 15.0 g/dL   HCT 39.8 36.0 - 46.0 %   MCV 88.6 78.0 - 100.0 fL   MCH 26.5 26.0 - 34.0 pg   MCHC 29.9 (L) 30.0 - 36.0 g/dL   RDW 16.0 (H) 11.5 - 15.5 %   Platelets 236 150 - 400 K/uL   Neutrophils Relative % 86 %   Neutro Abs 12.7 (H) 1.7 - 7.7 K/uL   Lymphocytes Relative 11 %   Lymphs Abs 1.6 0.7 - 4.0 K/uL   Monocytes Relative 3 %   Monocytes Absolute 0.5 0.1 - 1.0 K/uL   Eosinophils Relative 0 %   Eosinophils Absolute 0.0 0.0 - 0.7 K/uL   Basophils Relative 0 %   Basophils Absolute 0.0 0.0 - 0.1 K/uL  CBG monitoring, ED  Result Value Ref Range   Glucose-Capillary 230 (H) 65 - 99 mg/dL  I-Stat CG4 Lactic Acid, ED  (not at  [facility])  Result Value Ref Range   Lactic Acid, Venous 3.40 (HH) 0.5 - 1.9 mmol/L  Comment NOTIFIED PHYSICIAN      EKG  EKG Interpretation  Date/Time:  03-01-2016 15:49:32 EST Ventricular Rate:  91 PR Interval:    QRS Duration: 100 QT Interval:  412 QTC Calculation: 507 R Axis:   -95 Text Interpretation:  Sinus rhythm Left anterior fascicular block Abnormal R-wave progression, late transition Prolonged QT interval No significant change since last tracing Confirmed by Genesi Stefanko  MD, Mckenize Mezera [phone]) on 2016-03-01  4:09:30 PM       Radiology Dg Chest Port 1 View  Result Date: 03/01/16 CLINICAL DATA:  Altered mental status, hypotension, history breast cancer, stroke, vascular dementia, hypertension EXAM: PORTABLE CHEST 1 VIEW COMPARISON:  Portable exam 1606 hours compared 02/14/2016 FINDINGS: Upper normal size of cardiac silhouette. Atherosclerotic calcification and elongation of thoracic aorta. Pulmonary vascularity normal. Chronic elevation of RIGHT diaphragm. Minimal bibasilar atelectasis. Upper lungs clear with mild central peribronchial thickening noted. No pleural effusion or pneumothorax. IMPRESSION: Minimal bronchitic changes and bibasilar atelectasis. Aortic atherosclerosis. Electronically Signed   By: Lavonia Dana M.D.   On: March 01, 2016 16:19    Procedures Procedures (including critical care time)  CRITICAL CARE Performed by: Fredia Sorrow Total critical care time: 60 minutes Critical care time was exclusive of separately billable procedures and treating other patients. Critical care was necessary to treat or prevent imminent or life-threatening deterioration. Critical care was time spent personally by me on the following activities: development of treatment plan with patient and/or surrogate as well as nursing, discussions with consultants, evaluation of patient's response to treatment, examination of patient, obtaining history from patient or surrogate, ordering and performing treatments and interventions, ordering and review of laboratory studies, ordering and review of radiographic studies, pulse oximetry and re-evaluation of patient's condition.  INTUBATION Performed by: Acquanetta Cabanilla  Required items: required blood products, implants, devices, and special equipment available Patient identity confirmed: provided demographic data and hospital-assigned identification number Time out: Immediately prior to procedure a "time out" was called to verify the correct patient, procedure,  equipment, support staff and site/side marked as required.  Indications: Hypotension, altered mental status, respiratory failure   Intubation method: Glidescope Laryngoscopy   Preoxygenation: 100 percent oxygen BVM  Sedatives: None  Paralytic: None   Tube Size: 7.5 cuffed  Post-procedure assessment: chest rise and ETCO2 monitor Breath sounds: equal and absent over the epigastrium Tube secured with: ETT holder Chest x-ray interpreted by radiologist and me.  Chest x-ray findings: Unable to confirm ET tube placement due to cardiac arrest.   Patient tolerated the procedure well with no immediate complications.  cardiopulmonary resuscitation     Medications Ordered in ED Medications  naloxone (NARCAN) injection 2 mg (not administered)  aztreonam (AZACTAM) 500 mg in dextrose 5 % 50 mL IVPB (not administered)  levofloxacin (LEVAQUIN) IVPB 500 mg (not administered)  vancomycin (VANCOCIN) IVPB 1000 mg/200 mL premix (not administered)  norepinephrine (LEVOPHED) 4 mg in dextrose 5 % 250 mL (0.016 mg/mL) infusion (not administered)  naloxone (NARCAN) 2 MG/2ML injection (2 mg  Given Mar 01, 2016 1544)  sodium chloride 0.9 % bolus 1,000 mL (1,000 mLs Intravenous New Bag/Given 03-01-2016 1545)    And  sodium chloride 0.9 % bolus 1,000 mL (0 mLs Intravenous Stopped 2016-03-01 1653)    And  sodium chloride 0.9 % bolus 250 mL (250 mLs Intravenous New Bag/Given March 01, 2016 1651)  levofloxacin (LEVAQUIN) IVPB 750 mg (0 mg Intravenous Paused 2016-03-01 1732)  aztreonam (AZACTAM) 2 g in dextrose 5 % 50 mL IVPB (0 g Intravenous Stopped 2016/03/01 1653)  vancomycin (VANCOCIN) IVPB 1000 mg/200 mL premix (0 mg Intravenous Paused Mar 21, 2016 1732)     Initial Impression / Assessment and Plan / ED Course  I have reviewed the triage vital signs and the nursing notes.  Pertinent labs & imaging results that were available during my care of the patient were reviewed by me and considered in my medical decision making (see  chart for details).    Refer to hospital course information above.   Final Clinical Impressions(s) / ED Diagnoses   Final diagnoses:  Sepsis, due to unspecified organism [facility])  Cardiac arrest [facility])    New Prescriptions New Prescriptions   No medications on file     Fredia Sorrow, MD 2016/03/21 1907

## 2016-02-26 DEATH — deceased

## 2016-02-28 LAB — CULTURE, BLOOD (ROUTINE X 2)

## 2016-02-29 LAB — CULTURE, BLOOD (ROUTINE X 2): Culture: NO GROWTH

## 2016-03-25 NOTE — ED Provider Notes (Signed)
PT with positive blood culture.  On chart review, pt is deceased   Anne Johns, MD 03-07-16 [phone]

## 2018-05-22 IMAGING — CT CT CERVICAL SPINE W/O CM
3 of 6 series · 14 of 33 positions shown, 16 images · non-contrast
Comparison: 12/03/2015

CLINICAL DATA: Fell off bed, dementia

EXAM:
CT HEAD WITHOUT CONTRAST
CT CERVICAL SPINE WITHOUT CONTRAST
TECHNIQUE: Multidetector CT imaging of the head and cervical spine was
performed following the standard protocol without intravenous
contrast. Multiplanar CT image reconstructions of the cervical spine
were also generated.

[Series 6: coronal · coronal · 0.30mm/px · 3 of 73 slices shown]
[im 15/73  bone]
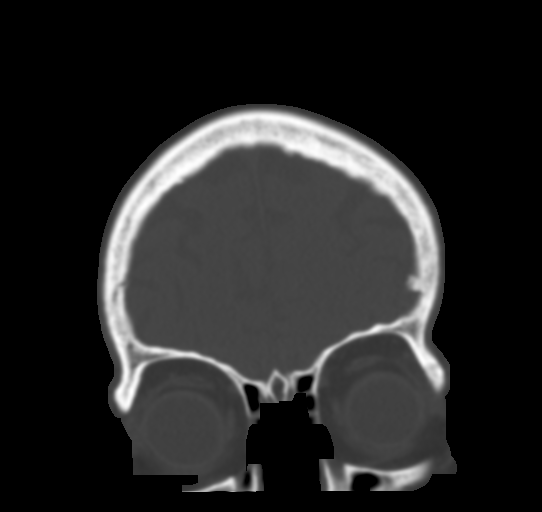
[im 29/73  bone]
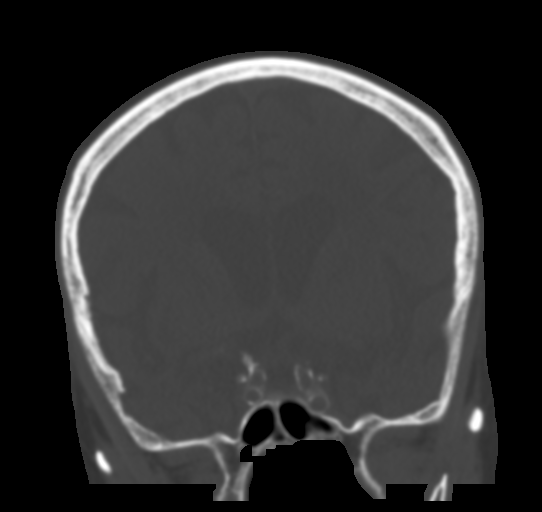
[im 44/73  bone]
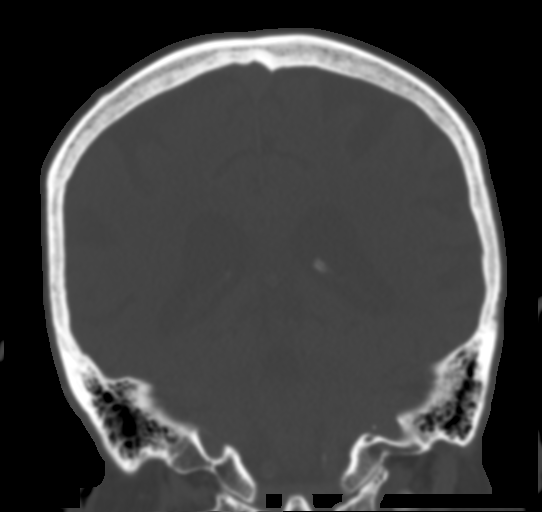

[Series 8: c-spine st · axial · 0.38mm/px · z∈[+988,+1092]mm · 6 of 74 slices shown, 8 images]
[im 11/74  soft-tissue]
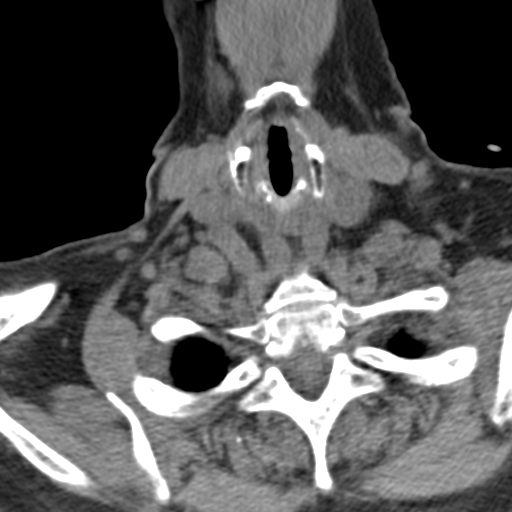
[im 11/74  bone]
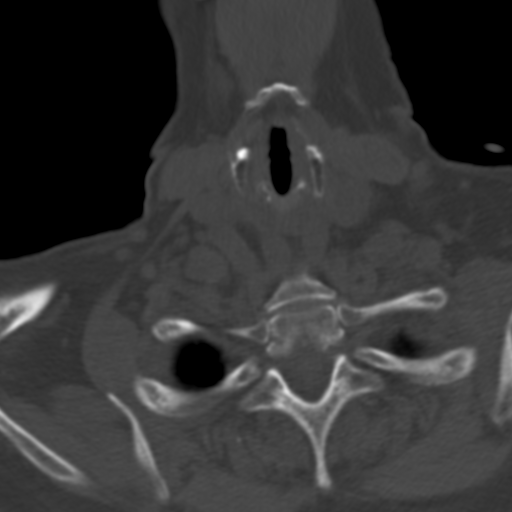
[im 21/74  bone]
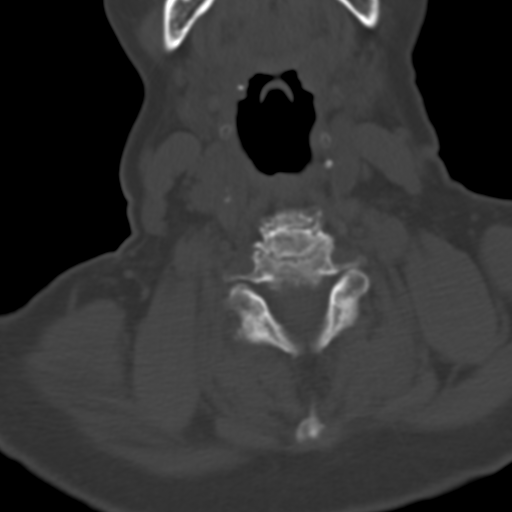
[im 32/74  bone]
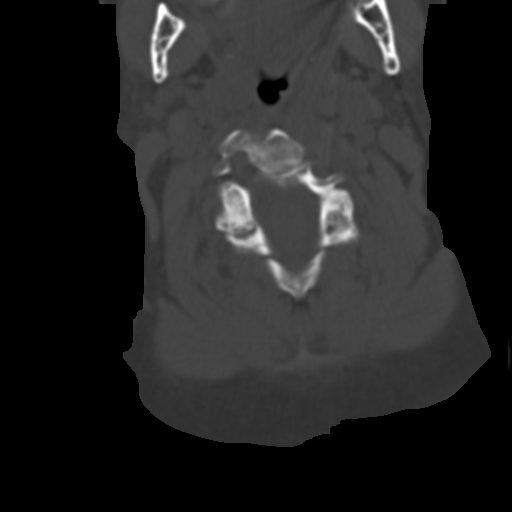
[im 42/74  bone]
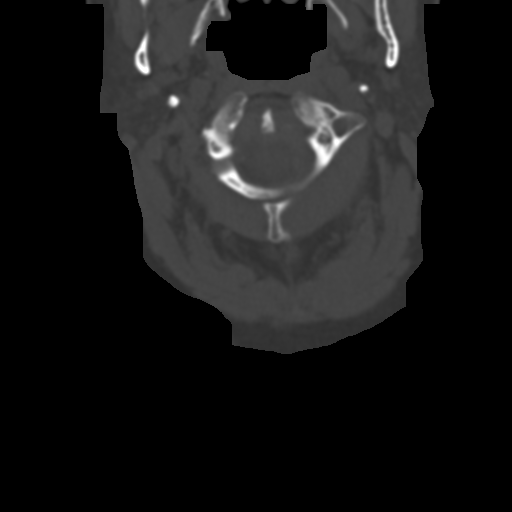
[im 53/74  soft-tissue]
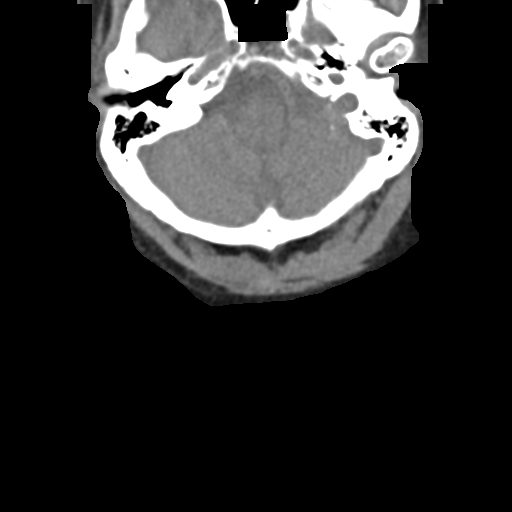
[im 53/74  bone]
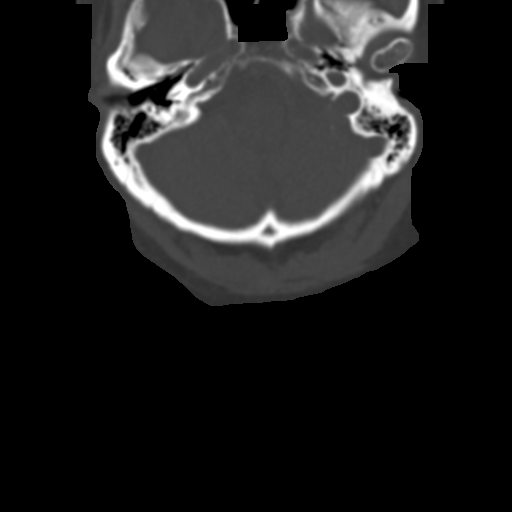
[im 63/74  bone]
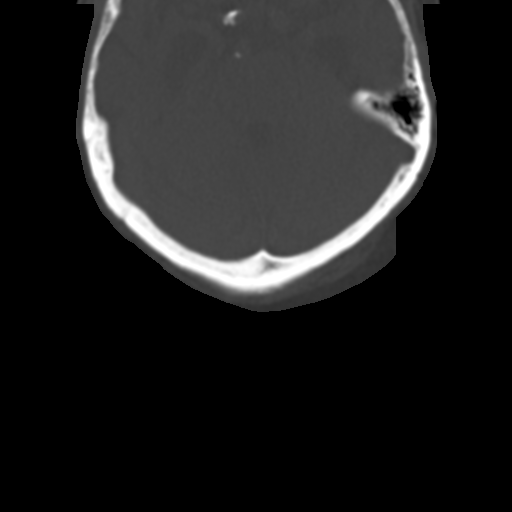

[Series 13: sagittal · sagittal · 0.29mm/px · 5 of 61 slices shown]
[im 11/61  bone]
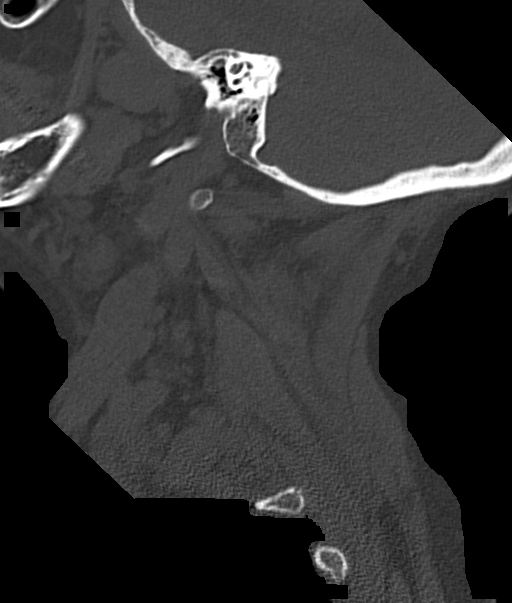
[im 21/61  bone]
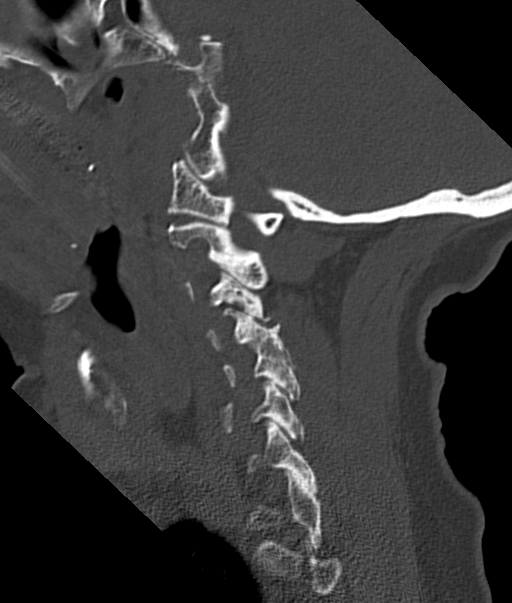
[im 31/61  bone]
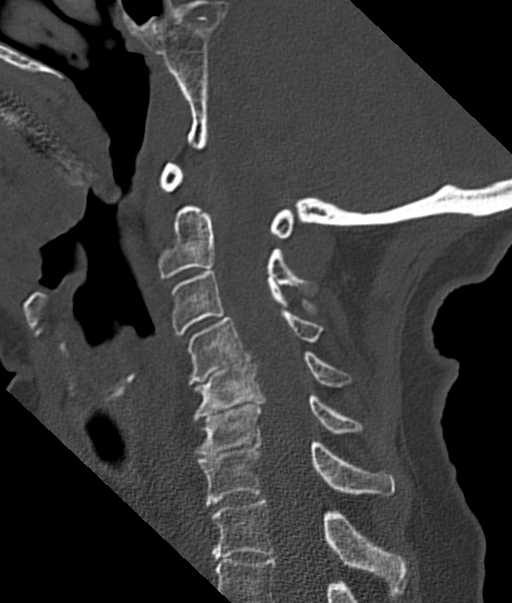
[im 41/61  bone]
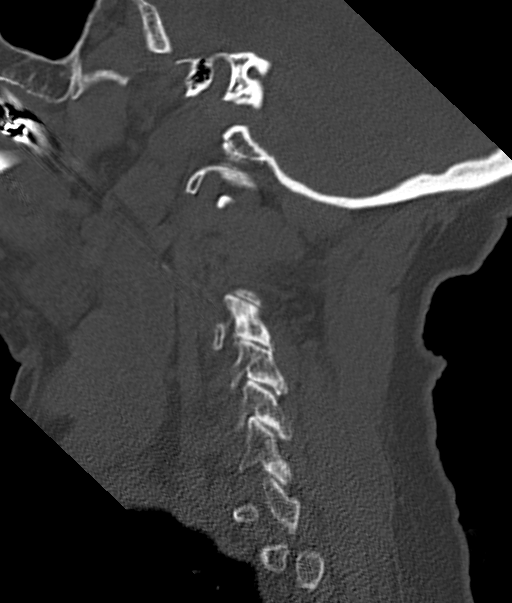
[im 51/61  bone]
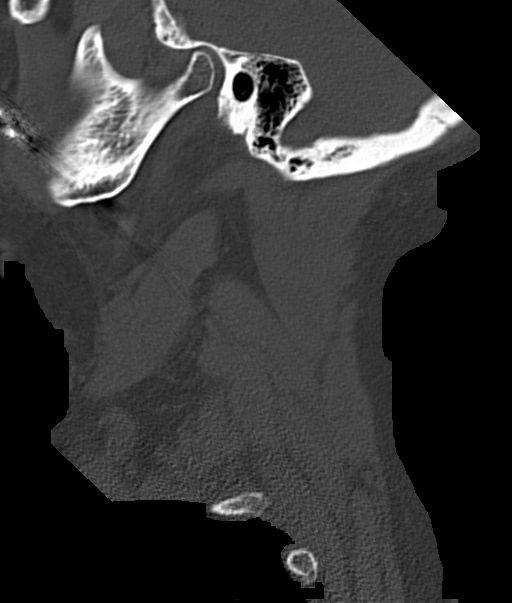

[14 of 33 positions shown; findings below may reference images not displayed]

FINDINGS: CT HEAD FINDINGS

Brain: No intracranial hemorrhage, mass effect or midline shift.
Stable atrophy and chronic white matter disease. Old infarct in left
occipital lobe with encephalomalacia is stable. No acute cortical
infarction. No mass lesion is noted on this unenhanced scan.

Vascular: Atherosclerotic calcifications of carotid siphon.
Atherosclerotic calcifications of vertebral arteries.

Skull: No skull fracture is noted.

Sinuses/Orbits: No acute findings

Other: None

CT CERVICAL SPINE FINDINGS

Alignment: Again noted mild reversal of cervical lordosis. C1-C2
relationship is unremarkable. There is mild anterolisthesis C3 on C4
vertebral body about 2 mm.

Skull base and vertebrae: No acute fracture is noted.

Soft tissues and spinal canal: No prevertebral soft tissue swelling.
No significant spinal canal stenosis.

Disc levels: There is disc space flattening with mild anterior and
mild posterior spurring at C4-C5, C5-C6 and C6-C7 level. Minimal
disc space flattening with anterior spurring at T1-T2 level.

Upper chest: There is no pneumothorax in visualized lung apices.

Other: Cervical airway is patent.
IMPRESSION: 1. No acute intracranial abnormality. Stable atrophy and chronic
white matter disease. Stable old infarct and encephalomalacia in
left occipital lobe.
2. No cervical spine acute fracture. There is about 2 mm
anterolisthesis C3 on C4 vertebral body. Degenerative changes at
C4-C5, C5-C6 and C6-C7 level. No prevertebral soft tissue swelling.
Cervical airway is patent.
